# Patient Record
Sex: Female | Born: 1960 | Race: White | Hispanic: No | Marital: Married | Smoking: Never smoker
Health system: Southern US, Community
[De-identification: ages and names within clinical notes are randomized; demographics above are authoritative.]

## PROBLEM LIST (undated history)

## (undated) DIAGNOSIS — L509 Urticaria, unspecified: Secondary | ICD-10-CM

## (undated) DIAGNOSIS — I10 Essential (primary) hypertension: Secondary | ICD-10-CM

## (undated) DIAGNOSIS — A0472 Enterocolitis due to Clostridium difficile, not specified as recurrent: Secondary | ICD-10-CM

## (undated) DIAGNOSIS — K5792 Diverticulitis of intestine, part unspecified, without perforation or abscess without bleeding: Secondary | ICD-10-CM

## (undated) DIAGNOSIS — E785 Hyperlipidemia, unspecified: Secondary | ICD-10-CM

## (undated) DIAGNOSIS — T783XXA Angioneurotic edema, initial encounter: Secondary | ICD-10-CM

## (undated) DIAGNOSIS — K589 Irritable bowel syndrome without diarrhea: Secondary | ICD-10-CM

## (undated) HISTORY — DX: Essential (primary) hypertension: I10

## (undated) HISTORY — DX: Enterocolitis due to Clostridium difficile, not specified as recurrent: A04.72

## (undated) HISTORY — DX: Urticaria, unspecified: L50.9

## (undated) HISTORY — DX: Hyperlipidemia, unspecified: E78.5

## (undated) HISTORY — DX: Irritable bowel syndrome, unspecified: K58.9

## (undated) HISTORY — DX: Diverticulitis of intestine, part unspecified, without perforation or abscess without bleeding: K57.92

## (undated) HISTORY — DX: Angioneurotic edema, initial encounter: T78.3XXA

## (undated) HISTORY — PX: NASAL SEPTUM SURGERY: SHX37

---

## 2001-06-29 ENCOUNTER — Encounter: Admission: RE | Admit: 2001-06-29 | Discharge: 2001-06-29 | Payer: Self-pay | Admitting: Family Medicine

## 2001-06-29 ENCOUNTER — Encounter: Payer: Self-pay | Admitting: Family Medicine

## 2005-11-04 HISTORY — PX: COLONOSCOPY: SHX174

## 2008-03-09 ENCOUNTER — Encounter: Admission: RE | Admit: 2008-03-09 | Discharge: 2008-03-09 | Payer: Self-pay | Admitting: Family Medicine

## 2011-08-05 DIAGNOSIS — A0472 Enterocolitis due to Clostridium difficile, not specified as recurrent: Secondary | ICD-10-CM

## 2011-08-05 HISTORY — DX: Enterocolitis due to Clostridium difficile, not specified as recurrent: A04.72

## 2011-08-27 ENCOUNTER — Encounter: Payer: Self-pay | Admitting: Gastroenterology

## 2011-08-27 ENCOUNTER — Ambulatory Visit (INDEPENDENT_AMBULATORY_CARE_PROVIDER_SITE_OTHER): Payer: BC Managed Care – PPO | Admitting: Gastroenterology

## 2011-08-27 ENCOUNTER — Ambulatory Visit (HOSPITAL_COMMUNITY)
Admission: RE | Admit: 2011-08-27 | Discharge: 2011-08-27 | Disposition: A | Discharge status: Home / Self Care | Payer: BC Managed Care – PPO | Source: Ambulatory Visit | Attending: Gastroenterology | Admitting: Gastroenterology

## 2011-08-27 VITALS — BP 145/87 | HR 66 | Temp 97.8°F | Ht 59.0 in | Wt 137.4 lb

## 2011-08-27 DIAGNOSIS — R1030 Lower abdominal pain, unspecified: Secondary | ICD-10-CM

## 2011-08-27 DIAGNOSIS — R109 Unspecified abdominal pain: Secondary | ICD-10-CM | POA: Insufficient documentation

## 2011-08-27 DIAGNOSIS — K5732 Diverticulitis of large intestine without perforation or abscess without bleeding: Secondary | ICD-10-CM | POA: Insufficient documentation

## 2011-08-27 DIAGNOSIS — R197 Diarrhea, unspecified: Secondary | ICD-10-CM | POA: Insufficient documentation

## 2011-08-27 DIAGNOSIS — I10 Essential (primary) hypertension: Secondary | ICD-10-CM | POA: Insufficient documentation

## 2011-08-27 MED ORDER — IOHEXOL 300 MG/ML  SOLN
100.0000 mL | Freq: Once | INTRAMUSCULAR | Status: AC | PRN
  Administered 2011-08-27: 100 mL via INTRAVENOUS

## 2011-08-27 NOTE — Assessment & Plan Note (Signed)
Documented diverticulitis by CT scan on 07/29/2011. Followup CT on 07/31/2011 showed stable but active uncomplicated diverticulitis. The patient completed a course of clindamycin and Augmentin. Complains of worsening abdominal pain, nausea, unable to eat, diarrhea. At this point would be concerned about complicated diverticulitis versus C. difficile colitis. Discussed approach with patient. Given significant tenderness on exam, would recommend repeat CT scan. Will also check stool for C. difficile and culture. If these studies are unrevealing, next up would be colonoscopy.  For now she will consume a bland, soft diet. If she were to develop fever, worsening abdominal pain, bloody stools, refractory vomiting she should let us know or present to the nearest emergency department.

## 2011-08-27 NOTE — Progress Notes (Signed)
Cc to PCP 

## 2011-08-27 NOTE — Patient Instructions (Addendum)
Please collect your stool as soon as possible. We have scheduled you for a CT scan of your abdomen. We will call you when we receive results.  If you develop worsening abdominal pain, fever, vomiting, rectal bleeding he should let us know or go to the nearest emergency department. Please take a probiotic daily or consume Dannon yogurt once daily. Please stick to a bland, soft diet at this time. Once you are asymptomatic, you can resume a high fiber diet.

## 2011-08-27 NOTE — Progress Notes (Signed)
Primary Care Physician:  Criss Rosales, MD  Primary Gastroenterologist:  Roetta Sessions, MD   Chief Complaint  Patient presents with  . Abdominal Pain    with cramps  . Diarrhea  . Nausea  . Weight Loss    loss 4 lb in a week    HPI:  Jennifer Suarez is a 50 y.o. female here for further evaluation of nausea, abd pain, diarrhea, weight loss. Gives h/o recurrent diverticulitis and possible IBS. In 04/2005 she had CT which documented acute diverticulitis (first episode). She has had total of eight episodes of taking antibiotics for suspected diverticulitis since that time. At some point, she developed facial swelling while on cipro and flagyl so subsequent diverticulitis has been treated with clindamycin and augmentin. In 07/2011 she developed another attack and had two CTs both which showed uncomplicated diverticulitis. She feels like she has never gotton over it.   Had been doing good with consuming high fiber diet prior to 07/2011. She has been treated for diverticulitis twice since 03/2011, once in 2011, once in 2008.  Now increased caffeine, coffee, stress at work. End of September, LLQ pain, saw PCP (saw four different people as doctor on vacation). Clindamycin/augmentin. Never felt like fully recovered from that bout. Now more diffuse lower abd pain, Stools small, lost four pounds in one week, nausea. Have loose stool every 30 minutes over the past week. Scared to eat as it makes pain worse. Takes anaspaz and xanax only as needed for abd pain. Friday night pain severe and felt like needed to go to ED. Started clear liquid diet and now calm down a little. No fever. Feels poorly.     Current Outpatient Prescriptions  Medication Sig Dispense Refill  . ALPRAZolam (XANAX) 0.25 MG tablet Take 0.25 mg by mouth at bedtime as needed.       Marland Kitchen atenolol (TENORMIN) 50 MG tablet Take 50 mg by mouth daily.        . hydrochlorothiazide (HYDRODIURIL) 25 MG tablet Take 25 mg by mouth daily.       .  hyoscyamine (ANASPAZ) 0.125 MG TBDP Place 0.125 mg under the tongue every 4 (four) hours as needed.          Allergies as of 08/27/2011 - Review Complete 08/27/2011  Allergen Reaction Noted  . Ciprofloxacin Other (See Comments) 08/27/2011  . Flagyl (metronidazole hcl) Other (See Comments) 08/27/2011  . Macrobid Other (See Comments) 08/27/2011  . Sulfa antibiotics Other (See Comments) 08/27/2011    Past Medical History  Diagnosis Date  . Diverticulitis     recurrent, treated with antibiotics eight times since 2006. CT positive 04/2005, 07/2011  . Irritable bowel syndrome     Past Surgical History  Procedure Date  . Colonoscopy 2007    Dr. Bing Plume to moderate sigmioid diverticulosis with no evidence for diverticulitis  . Nasal septum surgery   . Exploratory laparoscopy     age 59    Family History  Problem Relation Age of Onset  . Lung cancer Father   . Lung cancer Sister     ovarian?  . Cancer Mother     metastatic primary unknown  . Colon cancer Neg Hx     History   Social History  . Marital Status: Married    Spouse Name: N/A    Number of Children: N/A  . Years of Education: N/A   Occupational History  . Not on file.   Social History Main Topics  . Smoking status:  Never Smoker   . Smokeless tobacco: Not on file  . Alcohol Use: No  . Drug Use: No  . Sexually Active: Not on file   Other Topics Concern  . Not on file   Social History Narrative  . No narrative on file      ROS:  General:See HPI. Eyes: Negative for vision changes.  ENT: Negative for hoarseness, difficulty swallowing , nasal congestion. CV: Negative for chest pain, angina, palpitations, dyspnea on exertion, peripheral edema.  Respiratory: Negative for dyspnea at rest, dyspnea on exertion, cough, sputum, wheezing.  GI: See history of present illness. GU:  Negative for dysuria, hematuria, urinary incontinence, urinary frequency, nocturnal urination.  MS: Negative for joint  pain, low back pain.  Derm: Negative for rash or itching.  Neuro: Negative for weakness, abnormal sensation, seizure, frequent headaches, memory loss, confusion.  Psych: Negative for anxiety, depression, suicidal ideation, hallucinations.  Endo: Negative for unusual weight change.  Heme: Negative for bruising or bleeding. Allergy: Negative for rash or hives.    Physical Examination:  BP 145/87  Pulse 66  Temp(Src) 97.8 F (36.6 C) (Temporal)  Ht 4\' 11"  (1.499 m)  Wt 137 lb 6.4 oz (62.324 kg)  BMI 27.75 kg/m2   General: Well-nourished, well-developed in no acute distress.  Head: Normocephalic, atraumatic.   Eyes: Conjunctiva pink, no icterus. Mouth: Oropharyngeal mucosa moist and pink , no lesions erythema or exudate. Neck: Supple without thyromegaly, masses, or lymphadenopathy.  Lungs: Clear to auscultation bilaterally.  Heart: Regular rate and rhythm, no murmurs rubs or gallops.  Abdomen: Bowel sounds are normal, diffuse lower abd tenderness, moderate, nondistended, no hepatosplenomegaly or masses, no abdominal bruits or    hernia , no rebound or guarding.   Rectal: Not performed. Extremities: No lower extremity edema. No clubbing or deformities.  Neuro: Alert and oriented x 4 , grossly normal neurologically.  Skin: Warm and dry, no rash or jaundice.   Psych: Alert and cooperative, normal mood and affect.  Labs: 07/31/2011 Glucose 80, BUN 9, creatinine 0.87, calcium 9.1, albumin 4.1, alkaline phosphatase 63, AST 37, ALT 31, white blood cell count 13, hemoglobin 13.7, platelets 218 total bilirubin 0.6  Imaging Studies: CT of the abdomen and pelvis with contrast from 07/31/2011, compared to 07/29/2011. Physical inflammatory process involving the proximal sigmoid colon compatible with diverticulitis, mild paralytic ileus involving the left lower quadrant small bowel loops.

## 2011-08-28 ENCOUNTER — Other Ambulatory Visit: Payer: Self-pay | Admitting: Gastroenterology

## 2011-08-28 ENCOUNTER — Telehealth: Payer: Self-pay

## 2011-08-28 DIAGNOSIS — K5732 Diverticulitis of large intestine without perforation or abscess without bleeding: Secondary | ICD-10-CM

## 2011-08-28 LAB — CLOSTRIDIUM DIFFICILE BY PCR: Toxigenic C. Difficile by PCR: DETECTED — CR

## 2011-08-28 MED ORDER — AMOXICILLIN-POT CLAVULANATE 875-125 MG PO TABS: 1.0000 | ORAL_TABLET | Freq: Two times a day (BID) | ORAL | Status: AC

## 2011-08-28 NOTE — Progress Notes (Signed)
Quick Note:  Patient also with CDiff. Allergic to flagyl. Started augmentin today for persistent diverticulitis.  Need to start vancomycin 125mg  po QID for three weeks. Quantity sufficient.  Very important she take probiotic as previously noted. OV as planned. She should call if worsening diarrhea, abd pain, fever, vomiting. ______

## 2011-08-28 NOTE — Progress Notes (Signed)
Quick Note:  Tried to call pt- LMOM, spoke with pharmacist at [facility], they got augmentin rx but pt has not picked it up yet. Tried to give her rx for vancomycin but she said she didn't have it and we would have to call it in somewhere else. Waiting on return call from pt to get another pharmacy. ______

## 2011-08-28 NOTE — Progress Notes (Signed)
Quick Note:  Please let patient noted that her CT scan shows smoldering diverticulitis but no complications at this point. I have discussed findings with Dr. Jena Gauss. She needs to take any other course of Augmentin but this time for 3 weeks. For the next 3 days she should consume clear liquid diet only and then slowly graduate to a soft low fiber diet until resolution of her symptoms. Stool test are still pending.  Augmentin 875/125 mg by mouth twice a day for 21 days, quantity sufficient. SENT TO [facility] She needs to take a probiotic daily, ie Align or [employer]. She may consume Danon yogurt once daily instead if she prefers.  Needs OV in 3 weeks. If clinically doing better, we will plan on scheduling colonoscopy at that time. ______

## 2011-08-28 NOTE — Telephone Encounter (Signed)
T/C from [facility], C-Diff is positive.

## 2011-08-29 NOTE — Progress Notes (Signed)
Quick Note:  Please have the patient call us on Monday with a PR. ______

## 2011-08-29 NOTE — Telephone Encounter (Signed)
Addressed.

## 2011-08-31 LAB — STOOL CULTURE

## 2011-09-19 ENCOUNTER — Encounter: Payer: Self-pay | Admitting: General Practice

## 2011-09-19 ENCOUNTER — Encounter: Payer: Self-pay | Admitting: Gastroenterology

## 2011-09-19 ENCOUNTER — Ambulatory Visit (INDEPENDENT_AMBULATORY_CARE_PROVIDER_SITE_OTHER): Payer: BC Managed Care – PPO | Admitting: Gastroenterology

## 2011-09-19 VITALS — BP 130/70 | HR 70 | Temp 97.6°F | Ht 59.0 in | Wt 143.8 lb

## 2011-09-19 DIAGNOSIS — R197 Diarrhea, unspecified: Secondary | ICD-10-CM

## 2011-09-19 DIAGNOSIS — K5792 Diverticulitis of intestine, part unspecified, without perforation or abscess without bleeding: Secondary | ICD-10-CM

## 2011-09-19 DIAGNOSIS — K5732 Diverticulitis of large intestine without perforation or abscess without bleeding: Secondary | ICD-10-CM

## 2011-09-19 MED ORDER — ALIGN 4 MG PO CAPS: 4.0000 mg | ORAL_CAPSULE | Freq: Every day | ORAL | Status: DC

## 2011-09-19 NOTE — Assessment & Plan Note (Signed)
Recent acute diarrhea due to C Diff, treated with vancomycin given possible allergy to flagyl. Doing better but not quite back at baseline. H/O IBS. Recommend probiotics. Levsin as needed. Slowly add back fiber to diet.

## 2011-09-19 NOTE — Assessment & Plan Note (Addendum)
Recurrent diverticulitis, clinically resolved. Needs updated colonoscopy given abnormal CT findings and prior to possible surgical intervention. Plan for 3 weeks from now, she will be off antibiotics for 3-4 weeks.  I have discussed the risks, alternatives, benefits with regards to but not limited to the risk of reaction to medication, bleeding, infection, perforation and the patient is agreeable to proceed. Written consent to be obtained.  Arrange for referral to [facility].

## 2011-09-19 NOTE — Patient Instructions (Signed)
Please see separate instructions for colonoscopy. We will refer you to [facility] for consideration of elective surgery for recurrent diverticulitis. We have provided you with samples of Align. Please take one daily. When you run out of Align, he may buy this over-the-counter or other good options include [employer], Susten[employer].

## 2011-09-19 NOTE — Progress Notes (Signed)
Primary Care Physician:  Criss Rosales, MD  Primary Gastroenterologist:  Roetta Sessions, MD   Chief Complaint  Patient presents with  . Follow-up    HPI:  Jennifer Suarez is a 50 y.o. female here for f/u diverticulitis and C.Diff. She feels better. Stools are more formed but still multiple daily, at least five. C/O lot of gas. Stools are pencil thin. No n/v. Appetite improved. No heartburn, dysphagia. No fever. Completed antibiotics couple of days ago. Ready to see surgeon for elective partial colectomy. Needs colonoscopy prior to surgery to evaluate abnormal CT.  Current Outpatient Prescriptions  Medication Sig Dispense Refill  . ALPRAZolam (XANAX) 0.25 MG tablet Take 0.25 mg by mouth at bedtime as needed.       Marland Kitchen atenolol (TENORMIN) 50 MG tablet Take 50 mg by mouth daily.        . hydrochlorothiazide (HYDRODIURIL) 25 MG tablet Take 25 mg by mouth daily.       . hyoscyamine (ANASPAZ) 0.125 MG TBDP Place 0.125 mg under the tongue every 4 (four) hours as needed.        . Probiotic Product (ALIGN) 4 MG CAPS Take 4 mg by mouth daily.  30 capsule  0    Allergies as of 09/19/2011 - Review Complete 09/19/2011  Allergen Reaction Noted  . Ciprofloxacin Other (See Comments) 08/27/2011  . Flagyl (metronidazole hcl) Other (See Comments) 08/27/2011  . Macrobid Other (See Comments) 08/27/2011  . Sulfa antibiotics Other (See Comments) 08/27/2011    Past Medical History  Diagnosis Date  . Diverticulitis     recurrent, treated with antibiotics 9 times since 2006. CT positive 04/2005, 07/2011, 08/2011  . Irritable bowel syndrome   . Clostridium difficile colitis 08/2011    Past Surgical History  Procedure Date  . Colonoscopy 2007    Dr. Bing Plume to moderate sigmioid diverticulosis with no evidence for diverticulitis  . Nasal septum surgery   . Exploratory laparoscopy     age 72    Family History  Problem Relation Age of Onset  . Lung cancer Father   . Lung cancer Sister       ovarian?  . Cancer Mother     metastatic primary unknown  . Colon cancer Neg Hx     History   Social History  . Marital Status: Married    Spouse Name: N/A    Number of Children: N/A  . Years of Education: N/A   Occupational History  . manager of legal office    Social History Main Topics  . Smoking status: Never Smoker   . Smokeless tobacco: Not on file  . Alcohol Use: No  . Drug Use: No  . Sexually Active: Not on file   Other Topics Concern  . Not on file   Social History Narrative  . No narrative on file      ROS:  General: Negative for anorexia, weight loss, fever, chills, fatigue, weakness. Eyes: Negative for vision changes.  ENT: Negative for hoarseness, difficulty swallowing , nasal congestion. CV: Negative for chest pain, angina, palpitations, dyspnea on exertion, peripheral edema.  Respiratory: Negative for dyspnea at rest, dyspnea on exertion, cough, sputum, wheezing.  GI: See history of present illness. GU:  Negative for dysuria, hematuria, urinary incontinence, urinary frequency, nocturnal urination.  MS: Negative for joint pain, low back pain.  Derm: Negative for rash or itching.  Neuro: Negative for weakness, abnormal sensation, seizure, frequent headaches, memory loss, confusion.  Psych: Negative for anxiety, depression, suicidal  ideation, hallucinations. Worried about husband's illnesses. Endo: Negative for unusual weight change.  Heme: Negative for bruising or bleeding. Allergy: Negative for rash or hives.    Physical Examination:  BP 130/70  Pulse 70  Temp(Src) 97.6 F (36.4 C) (Temporal)  Ht 4\' 11"  (1.499 m)  Wt 143 lb 12.8 oz (65.227 kg)  BMI 29.04 kg/m2   General: Well-nourished, well-developed in no acute distress.  Head: Normocephalic, atraumatic.   Eyes: Conjunctiva pink, no icterus. Mouth: Oropharyngeal mucosa moist and pink , no lesions erythema or exudate. Neck: Supple without thyromegaly, masses, or lymphadenopathy.   Lungs: Clear to auscultation bilaterally.  Heart: Regular rate and rhythm, no murmurs rubs or gallops.  Abdomen: Bowel sounds are normal, minimal lower abd tenderness, nondistended, no hepatosplenomegaly or masses, no abdominal bruits or    hernia , no rebound or guarding.   Rectal: Not performed. Extremities: No lower extremity edema. No clubbing or deformities.  Neuro: Alert and oriented x 4 , grossly normal neurologically.  Skin: Warm and dry, no rash or jaundice.   Psych: Alert and cooperative, normal mood and affect.

## 2011-09-20 ENCOUNTER — Telehealth: Payer: Self-pay | Admitting: Gastroenterology

## 2011-09-20 NOTE — Telephone Encounter (Signed)
Referral and notes faxed to [facility]

## 2011-09-23 ENCOUNTER — Telehealth: Payer: Self-pay | Admitting: Gastroenterology

## 2011-09-23 NOTE — Telephone Encounter (Signed)
Received fax from  [facility]- Pt is scheduled to see Dr Carolynne Edouard on 10/01/11 @ 9:10- she is aware of appt

## 2011-10-01 ENCOUNTER — Ambulatory Visit (INDEPENDENT_AMBULATORY_CARE_PROVIDER_SITE_OTHER): Payer: BC Managed Care – PPO | Admitting: General Surgery

## 2011-10-01 ENCOUNTER — Encounter (INDEPENDENT_AMBULATORY_CARE_PROVIDER_SITE_OTHER): Payer: Self-pay | Admitting: General Surgery

## 2011-10-01 DIAGNOSIS — K5732 Diverticulitis of large intestine without perforation or abscess without bleeding: Secondary | ICD-10-CM

## 2011-10-01 DIAGNOSIS — A0472 Enterocolitis due to Clostridium difficile, not specified as recurrent: Secondary | ICD-10-CM | POA: Insufficient documentation

## 2011-10-01 NOTE — Patient Instructions (Signed)
Continue diet for diverticulitis

## 2011-10-04 ENCOUNTER — Telehealth: Payer: Self-pay | Admitting: Gastroenterology

## 2011-10-04 NOTE — Telephone Encounter (Signed)
Thanks. Let's refer to [facility] if patient interested.

## 2011-10-04 NOTE — Telephone Encounter (Signed)
Called [facility], they do not test for medication allergies - they refer to either [facility] or [facility]

## 2011-10-04 NOTE — Telephone Encounter (Signed)
Patient inquired about allergy testing to determine which antibiotic she is allergic to. Had facial swelling while taking combination of cipro and flagy.   Please call [facility]. Ask if they are capable of doing testing to determine if a patient is allergic to flagy, cipro, sulfa. If so, please make referral.

## 2011-10-07 ENCOUNTER — Encounter (HOSPITAL_COMMUNITY): Payer: Self-pay | Admitting: Pharmacy Technician

## 2011-10-07 MED ORDER — SODIUM CHLORIDE 0.45 % IV SOLN
Freq: Once | INTRAVENOUS | Status: AC
  Administered 2011-10-08: 14:00:00 via INTRAVENOUS

## 2011-10-07 NOTE — Telephone Encounter (Signed)
Called pt LM w/ husband

## 2011-10-07 NOTE — Telephone Encounter (Signed)
Noted. She can just let us know if/when she wants to pursue it.

## 2011-10-07 NOTE — Telephone Encounter (Signed)
Pt called back- she would like to wait on a referral to either [facility] or [facility] until after her upcoming TCS-

## 2011-10-08 ENCOUNTER — Encounter (HOSPITAL_COMMUNITY): Payer: Self-pay | Admitting: *Deleted

## 2011-10-08 ENCOUNTER — Encounter (HOSPITAL_COMMUNITY)
Admission: RE | Disposition: A | Discharge status: Home / Self Care | Payer: Self-pay | Source: Ambulatory Visit | Attending: Internal Medicine

## 2011-10-08 ENCOUNTER — Ambulatory Visit (HOSPITAL_COMMUNITY)
Admission: RE | Admit: 2011-10-08 | Discharge: 2011-10-08 | Disposition: A | Discharge status: Home / Self Care | Payer: BC Managed Care – PPO | Source: Ambulatory Visit | Attending: Internal Medicine | Admitting: Internal Medicine

## 2011-10-08 DIAGNOSIS — K5792 Diverticulitis of intestine, part unspecified, without perforation or abscess without bleeding: Secondary | ICD-10-CM

## 2011-10-08 DIAGNOSIS — K5730 Diverticulosis of large intestine without perforation or abscess without bleeding: Secondary | ICD-10-CM

## 2011-10-08 DIAGNOSIS — R933 Abnormal findings on diagnostic imaging of other parts of digestive tract: Secondary | ICD-10-CM | POA: Insufficient documentation

## 2011-10-08 HISTORY — PX: COLONOSCOPY: SHX5424

## 2011-10-08 SURGERY — COLONOSCOPY
Anesthesia: Moderate Sedation

## 2011-10-08 MED ORDER — MEPERIDINE HCL 100 MG/ML IJ SOLN
INTRAMUSCULAR | Status: AC
  Filled 2011-10-08: qty 2

## 2011-10-08 MED ORDER — MEPERIDINE HCL 100 MG/ML IJ SOLN
INTRAMUSCULAR | Status: DC | PRN
  Administered 2011-10-08: 25 mg via INTRAVENOUS
  Administered 2011-10-08: 50 mg via INTRAVENOUS
  Administered 2011-10-08: 25 mg via INTRAVENOUS

## 2011-10-08 MED ORDER — STERILE WATER FOR IRRIGATION IR SOLN
Status: DC | PRN
  Administered 2011-10-08: 15:00:00

## 2011-10-08 MED ORDER — MIDAZOLAM HCL 5 MG/5ML IJ SOLN
INTRAMUSCULAR | Status: AC
  Filled 2011-10-08: qty 10

## 2011-10-08 MED ORDER — MIDAZOLAM HCL 5 MG/5ML IJ SOLN
INTRAMUSCULAR | Status: DC | PRN
  Administered 2011-10-08 (×2): 2 mg via INTRAVENOUS
  Administered 2011-10-08: 1 mg via INTRAVENOUS

## 2011-10-08 NOTE — H&P (Signed)
  I have seen & examined the patient prior to the procedure(s) today and reviewed the history and physical/consultation.  There have been no changes.  After consideration of the risks, benefits, alternatives and imponderables, the patient has consented to the procedure(s).   

## 2011-10-11 ENCOUNTER — Encounter (INDEPENDENT_AMBULATORY_CARE_PROVIDER_SITE_OTHER): Payer: Self-pay | Admitting: General Surgery

## 2011-10-11 NOTE — Progress Notes (Signed)
Subjective:     Patient ID: Jennifer Suarez, female   DOB: [birth year], 50 y.o.   MRN: [id number]  HPI We are asked to see the patient in consultation by Dr. Selinda Flavin to evaluate her for diverticulitis. The patient is a 50 year old white female who has apparently had several episodes of diverticulitis dating back to about 2006. She had a CT scan in 2006 that documented the diverticulitis. She also had a CT scan recently that also documented uncomplicated diverticulitis of the sigmoid colon. Since 2006 anytime she had some lower abdominal pain she went to her medical doctor and was placed on antibiotics. There is not good documentation as to which of these episodes was due to diverticulitis. With her most recent episode of diverticulitis she was started on antibiotics but developed C. difficile colitis and got worse. She was then treated with p.o. vancomycin for 3 weeks and she is better now. She currently denies any fevers or chills and her abdominal pain is almost completely resolved. She is scheduled for colonoscopy in the next couple weeks.  Review of Systems  Constitutional: Negative.   HENT: Negative.   Eyes: Negative.   Respiratory: Negative.   Cardiovascular: Negative.   Gastrointestinal: Positive for abdominal pain.  Genitourinary: Negative.   Musculoskeletal: Negative.   Skin: Negative.   Neurological: Negative.   Hematological: Negative.   Psychiatric/Behavioral: Negative.        Objective:   Physical Exam  Constitutional: She is oriented to person, place, and time. She appears well-developed and well-nourished.  HENT:  Head: Normocephalic and atraumatic.  Eyes: Conjunctivae and EOM are normal. Pupils are equal, round, and reactive to light.  Neck: Normal range of motion. Neck supple.  Cardiovascular: Normal rate, regular rhythm and normal heart sounds.   Pulmonary/Chest: Effort normal and breath sounds normal.  Abdominal: Soft. Bowel sounds are normal.       Mild left  lower quadrant pain. no palpable mass. No palpable guarding.  Musculoskeletal: Normal range of motion.  Neurological: She is alert and oriented to person, place, and time.  Skin: Skin is warm and dry.  Psychiatric: She has a normal mood and affect. Her behavior is normal.       Assessment:     Simple sigmoid diverticulitis complicated by C. difficile colitis    Plan:     At this point I have no way of documenting that she's had more than 2 episodes of simple diverticulitis in the last 6 years or so. Her most recent scan showed no evidence of complicating features of diverticulitis. It may have been that her more recent episode was more severe because of the C. difficile colitis. I recommended some time off medications. I would also like to see what her colonoscopy shows in the next couple weeks. At this point I would be inclined to follow her over time and see if she develops more episodes of diverticulitis. If she does then she might benefit from an elective sigmoid colectomy. I would also like to give her more time to get over the C. difficile colitis before subjecting her to more antibiotics and stress.

## 2011-10-14 ENCOUNTER — Ambulatory Visit (INDEPENDENT_AMBULATORY_CARE_PROVIDER_SITE_OTHER): Payer: BC Managed Care – PPO | Admitting: General Surgery

## 2011-10-14 ENCOUNTER — Encounter (INDEPENDENT_AMBULATORY_CARE_PROVIDER_SITE_OTHER): Payer: Self-pay | Admitting: General Surgery

## 2011-10-14 VITALS — BP 146/90 | HR 64 | Temp 97.2°F | Resp 16 | Ht 59.0 in | Wt 137.4 lb

## 2011-10-14 DIAGNOSIS — K5732 Diverticulitis of large intestine without perforation or abscess without bleeding: Secondary | ICD-10-CM

## 2011-10-14 NOTE — Patient Instructions (Signed)
Call if you have any flare up of symtoms

## 2011-10-14 NOTE — Progress Notes (Signed)
Subjective:     Patient ID: Jennifer Suarez, female   DOB: 1961/03/12, 50 y.o.   MRN: [id number]  HPI The patient is a 50 year old white female who we saw her recently with sigmoid diverticulitis complicated by C. difficile colitis. She is now recovered and is pain-free. She had a recent colonoscopy that showed only some diverticuli in the sigmoid colon but no inflammatory change. She feels good today.  Review of Systems     Objective:   Physical Exam On exam her abdomen is soft and nontender. No palpable mass or hepatosplenomegaly.    Assessment:     Possible recurring simple diverticulitis of the sigmoid colon    Plan:     At this point she will try a diet modifications. If she has any recurrence of her left lower quadrant pain or fever she agrees to call us right away.

## 2011-10-17 ENCOUNTER — Encounter (HOSPITAL_COMMUNITY): Payer: Self-pay | Admitting: Internal Medicine

## 2012-04-03 ENCOUNTER — Encounter: Payer: Self-pay | Admitting: Internal Medicine

## 2013-11-04 DIAGNOSIS — E119 Type 2 diabetes mellitus without complications: Secondary | ICD-10-CM

## 2013-11-04 HISTORY — DX: Type 2 diabetes mellitus without complications: E11.9

## 2015-02-09 ENCOUNTER — Telehealth: Payer: Self-pay | Admitting: Nurse Practitioner

## 2015-02-09 ENCOUNTER — Other Ambulatory Visit: Payer: Self-pay

## 2015-02-09 ENCOUNTER — Encounter: Payer: Self-pay | Admitting: Nurse Practitioner

## 2015-02-09 ENCOUNTER — Ambulatory Visit (INDEPENDENT_AMBULATORY_CARE_PROVIDER_SITE_OTHER): Payer: 59 | Admitting: Nurse Practitioner

## 2015-02-09 ENCOUNTER — Encounter (INDEPENDENT_AMBULATORY_CARE_PROVIDER_SITE_OTHER): Payer: Self-pay

## 2015-02-09 VITALS — BP 117/75 | HR 58 | Temp 97.6°F | Ht 59.0 in | Wt 138.4 lb

## 2015-02-09 DIAGNOSIS — R1030 Lower abdominal pain, unspecified: Secondary | ICD-10-CM

## 2015-02-09 DIAGNOSIS — K5732 Diverticulitis of large intestine without perforation or abscess without bleeding: Secondary | ICD-10-CM

## 2015-02-09 LAB — COMPREHENSIVE METABOLIC PANEL
ALK PHOS: 56 U/L (ref 39–117)
ALT: 18 U/L (ref 0–35)
AST: 21 U/L (ref 0–37)
Albumin: 4.1 g/dL (ref 3.5–5.2)
BILIRUBIN TOTAL: 0.6 mg/dL (ref 0.2–1.2)
BUN: 11 mg/dL (ref 6–23)
CALCIUM: 8.7 mg/dL (ref 8.4–10.5)
CHLORIDE: 96 meq/L (ref 96–112)
CO2: 26 mEq/L (ref 19–32)
CREATININE: 0.78 mg/dL (ref 0.50–1.10)
Glucose, Bld: 123 mg/dL — ABNORMAL HIGH (ref 70–99)
Potassium: 3.4 mEq/L — ABNORMAL LOW (ref 3.5–5.3)
Sodium: 138 mEq/L (ref 135–145)
Total Protein: 6.3 g/dL (ref 6.0–8.3)

## 2015-02-09 LAB — LIPASE: LIPASE: 13 U/L (ref 0–75)

## 2015-02-09 NOTE — Telephone Encounter (Signed)
Tried to call pt- LMOM 

## 2015-02-09 NOTE — Patient Instructions (Signed)
1. Have your labs and CT done ASAP. 2. We will call you when we get the results 3. We will work on arranging the CT at the [place] 4. Follow-up in 6-8 weeks for routine check or sooner if there's any changes 5. If your abdominal pain becomes severe, you have uncontrollable nausea and vomiting, inability to tolerate drinking fluids, or a sudden high fever proceed to the ER.

## 2015-02-09 NOTE — Progress Notes (Signed)
Referring Provider: Selinda Flavin, MD Primary Care Physician:  Selinda Flavin, MD Primary GI: Dr. Jena Gauss  Chief Complaint  Patient presents with  . Diverticulosis    HPI:   54 year old female presents for evaluation of diverticulosis. Has a history of sigmoid diverticulosis with repeated episodes of diverticulitis treated with antibiotics. Typically her PCP we'll treat her with antibiotics without CT scan evaluation. She was seen by surgery for evaluation of elective sigmoid colectomy, however because no CT has been done for most of her episodes recently to document greater than 3 episodes and timeframe. At that point decide to electively follow her over time and further evaluate if repeat issues. Last colonoscopy done 10/08/2011 showed left sided colonic diverticulosis and recommended substantial daily fiber supplementation, and evaluation for elective segmental resection of the affected colon to minimize chances of recurrent and/or complicated diverticulitis in the future.  Today she states she's been following a relatively strict diverticulosis diet. She called her surgeon who said it had been too long since last seeing her. She has had worsening abdominal tenderness over the past couple/few weeks. In the past couple days her abdominal pain is getting worse. Yesterday she was huring a lot and finally tried taking her Apaspaz and Motrin which helped some. She has put herself on a BRAT diet as of yesterday. Still having some pain today which is in her LLQ with some sharp shooting characteristics. Admits some nausea, denies vomiting. Has had a lot of stress at work as well. Denies fever, chills, Last bowel movement was yesterday and formed and soft.   Past Medical History  Diagnosis Date  . Diverticulitis     recurrent, treated with antibiotics 9 times since 2006. CT positive 04/2005, 07/2011, 08/2011  . Irritable bowel syndrome   . Clostridium difficile colitis 08/2011  . Hypertension   .  Hyperlipidemia     Past Surgical History  Procedure Laterality Date  . Colonoscopy  2007    Dr. Bing Plume to moderate sigmioid diverticulosis with no evidence for diverticulitis  . Nasal septum surgery    . Exploratory laparoscopy      age 53  . Colonoscopy  10/08/2011    ZOX:[id number] sided colonic diverticulosis    Current Outpatient Prescriptions  Medication Sig Dispense Refill  . ALPRAZolam (XANAX) 0.25 MG tablet Take 0.25 mg by mouth daily as needed. Anxiety    . atenolol (TENORMIN) 50 MG tablet Take 50 mg by mouth daily.      Marland Kitchen atorvastatin (LIPITOR) 20 MG tablet     . hydrochlorothiazide (MICROZIDE) 12.5 MG capsule Take 12.5 mg by mouth daily.      . hyoscyamine (ANASPAZ) 0.125 MG TBDP Place 0.125 mg under the tongue every 4 (four) hours as needed. Relax Intestine    . metFORMIN (GLUCOPHAGE-XR) 500 MG 24 hr tablet Take 1,000 mg by mouth daily with breakfast.     . Probiotic Product (ALIGN) 4 MG CAPS Take 4 mg by mouth daily. 30 capsule 0  . PROAIR HFA 108 (90 BASE) MCG/ACT inhaler      No current facility-administered medications for this visit.    Allergies as of 02/09/2015 - Review Complete 02/09/2015  Allergen Reaction Noted  . Flagyl [metronidazole hcl] Other (See Comments) 08/27/2011  . Nitrofurantoin monohyd macro Other (See Comments) 08/27/2011  . Sulfa antibiotics Other (See Comments) 08/27/2011  . Ciprofloxacin Swelling 08/27/2011    Family History  Problem Relation Age of Onset  . Lung cancer Father   .  Cancer Father   . Lung cancer Sister     ovarian?  . Cancer Sister     lung and ovarian  . Cancer Mother     metastatic primary unknown  . Colon cancer Neg Hx     History   Social History  . Marital Status: Married    Spouse Name: N/A  . Number of Children: N/A  . Years of Education: N/A   Occupational History  . manager of legal office    Social History Main Topics  . Smoking status: Never Smoker   . Smokeless tobacco: Never Used  .  Alcohol Use: No  . Drug Use: No  . Sexual Activity: Not on file   Other Topics Concern  . None   Social History Narrative    Review of Systems: General: Negative for anorexia, weight loss, fever, chills, fatigue, weakness. Eyes: Negative for vision changes.  ENT: Negative for difficulty swallowing. CV: Negative for chest pain, angina, palpitations, peripheral edema.  Respiratory: Negative for dyspnea at rest, cough, wheezing.  GI: See history of present illness. MS: Negative for joint pain, low back pain.  Derm: Negative for rash or itching.  Neuro: Negative for weakness, abnormal sensation, seizure, frequent headaches, memory loss, confusion.  Psych: Negative for anxiety, depression, suicidal ideation, hallucinations.  Endo: Negative for unusual weight change.  Heme: Negative for bruising or bleeding. Allergy: Negative for rash or hives.   Physical Exam: BP 117/75 mmHg  Pulse 58  Temp(Src) 97.6 F (36.4 C) (Oral)  Ht 4\' 11"  (1.499 m)  Wt 138 lb 6.4 oz (62.778 kg)  BMI 27.94 kg/m2 General:   Alert and oriented. No distress noted. Pleasant and cooperative. Appears in pain but non-toxic. Head:  Normocephalic and atraumatic. Eyes:  Conjuctiva clear without scleral icterus. Neck:  Supple, without mass or thyromegaly. Lungs:  Clear to auscultation bilaterally. No wheezes, rales, or rhonchi. No distress.  Heart:  S1, S2 present without murmurs, rubs, or gallops. Regular rate and rhythm. Abdomen:  +BS, soft, and non-distended. Moderate to severe TTP LLQ. No rebound or guarding. No HSM or masses noted. Msk:  Symmetrical without gross deformities. Normal posture. Pulses:  2+ DP noted bilaterally Extremities:  Without edema. Neurologic:  Alert and  oriented x4;  grossly normal neurologically. Skin:  Intact without significant lesions or rashes. Psych:  Alert and cooperative. Normal mood and affect.    02/09/2015 10:23 AM

## 2015-02-09 NOTE — Telephone Encounter (Signed)
Receive the completed CT of abdomen and [facility]. Impression includes no acute findings involving the abdomen or pelvis, fatty infiltration of the liver, diverticular disease without associated inflammation, likely tiny bilateral renal cysts. Labs still pending. Please call the patient notify her that her CT does not indicate she has acute diverticulitis. Continue taking the hyoscyamine (Anaspaz) that has been helping her abdominal pain. We will call her when we get the results of her labs.

## 2015-02-10 ENCOUNTER — Telehealth: Payer: Self-pay | Admitting: Internal Medicine

## 2015-02-10 ENCOUNTER — Telehealth: Payer: Self-pay | Admitting: Nurse Practitioner

## 2015-02-10 LAB — CBC WITH DIFFERENTIAL/PLATELET
BASOS ABS: 0 10*3/uL (ref 0.0–0.1)
Basophils Relative: 0 % (ref 0–1)
Eosinophils Absolute: 0.3 10*3/uL (ref 0.0–0.7)
Eosinophils Relative: 3 % (ref 0–5)
HEMATOCRIT: 40.7 % (ref 36.0–46.0)
Hemoglobin: 13.5 g/dL (ref 12.0–15.0)
LYMPHS ABS: 3.1 10*3/uL (ref 0.7–4.0)
LYMPHS PCT: 34 % (ref 12–46)
MCH: 29.4 pg (ref 26.0–34.0)
MCHC: 33.2 g/dL (ref 30.0–36.0)
MCV: 88.7 fL (ref 78.0–100.0)
MONO ABS: 0.4 10*3/uL (ref 0.1–1.0)
Monocytes Relative: 4 % (ref 3–12)
NEUTROS ABS: 5.4 10*3/uL (ref 1.7–7.7)
Neutrophils Relative %: 59 % (ref 43–77)
PLATELETS: 225 10*3/uL (ref 150–400)
RBC: 4.59 MIL/uL (ref 3.87–5.11)
RDW: 12.9 % (ref 11.5–15.5)
WBC: 9.1 10*3/uL (ref 4.0–10.5)

## 2015-02-10 NOTE — Telephone Encounter (Signed)
Received message to please call patient. Called patient and reexplained her CT and lab results. She states she started having diarrhea today. Offered to do stool studies to check for intestinal infection which she declined. She states the medicine she has been taking has been helping. Advised to continue this. If she has severe symptoms (severe abdominal pain, high fever, intractable N/V, etc) to proceed to the ER. Advised to call us on Monday to let us know how she's doing.

## 2015-02-10 NOTE — Telephone Encounter (Signed)
Pt would like a copy of the CT scan and labs faxed to her at [phone].

## 2015-02-10 NOTE — Telephone Encounter (Signed)
I spoke with the pt.  

## 2015-02-10 NOTE — Telephone Encounter (Signed)
Patient left message returning call, please call back at [phone]

## 2015-02-10 NOTE — Telephone Encounter (Signed)
Pt is aware, she is still having cramps and diarrhea today. Her lab results are in epic.

## 2015-02-10 NOTE — Telephone Encounter (Signed)
Called and spoke with patient. See new telephone note for details.

## 2015-02-13 ENCOUNTER — Other Ambulatory Visit: Payer: Self-pay

## 2015-02-13 ENCOUNTER — Other Ambulatory Visit: Payer: Self-pay | Admitting: Gastroenterology

## 2015-02-13 DIAGNOSIS — R197 Diarrhea, unspecified: Secondary | ICD-10-CM

## 2015-02-13 NOTE — Telephone Encounter (Signed)
If she is having diarrhea persistent for more than 3-4 days, I would recommend checking stool for culture, C.diff PCR. If GI bug, then she would be contagious if ongoing diarrhea. But hard to say if that's what it is based on limited information.   If her heartburn is only occasional or just started, she can use Zantac 150mg  BID prn or TUMS as per label.   Please save for Eric's review.

## 2015-02-13 NOTE — Telephone Encounter (Signed)
Pt called office again and was wanting to know if she does have a GI Bug is she contagious .  States she is working today.

## 2015-02-13 NOTE — Telephone Encounter (Addendum)
Pt called and she still diarrhea and cramping is better. States she feels like she is having heartburn. States she still is not back to normal but she is better than she was when she came to us last week. Please advise

## 2015-02-13 NOTE — Telephone Encounter (Signed)
Noted. Called pt and she is aware of recommendations. Wants to wait and think about doing stool studies.

## 2015-02-14 LAB — C. DIFFICILE GDH AND TOXIN A/B
C. difficile GDH: NOT DETECTED
C. difficile Toxin A/B: NOT DETECTED

## 2015-02-14 NOTE — Progress Notes (Signed)
Quick Note:  I have no idea why this was still done. I cancelled this one that Candy put in wrong and ordered the PCR (which is in process). ______

## 2015-02-16 ENCOUNTER — Other Ambulatory Visit: Payer: Self-pay

## 2015-02-16 ENCOUNTER — Telehealth: Payer: Self-pay | Admitting: Internal Medicine

## 2015-02-16 DIAGNOSIS — R197 Diarrhea, unspecified: Secondary | ICD-10-CM

## 2015-02-16 LAB — STOOL CULTURE

## 2015-02-16 LAB — CLOSTRIDIUM DIFFICILE BY PCR: CDIFFPCR: NOT DETECTED

## 2015-02-16 NOTE — Telephone Encounter (Signed)
Culture is still pending. I called [facility] to make sure they were doing cdiff by pcr. They said they did not have any orders for that. I had them add it on to the stool they still had. cdiff by pcr orders were never released and that is why they couldn't see those orders. They were able to add it on and it is pending now.

## 2015-02-16 NOTE — Progress Notes (Signed)
Quick Note:  Please let patient know her stool culture and Cdiff were negative.  Defer to Minerva Areolaric who recently saw her. Looks like there is a pending CT [place]? ______

## 2015-02-16 NOTE — Telephone Encounter (Signed)
PATIENT CALLED INQUIRING ABOUT STOOL SAMPLE RESULTS,  EXPLAINED TO THE PATIENT IT CAN TAKE 7-10 DAYS AND THE NURSE WOULD BE CALLING HER AS SOON AS RESULTS WERE IN

## 2015-02-16 NOTE — Telephone Encounter (Signed)
Thanks

## 2015-02-17 ENCOUNTER — Telehealth: Payer: Self-pay | Admitting: Internal Medicine

## 2015-02-17 ENCOUNTER — Telehealth: Payer: Self-pay | Admitting: Nurse Practitioner

## 2015-02-17 NOTE — Telephone Encounter (Signed)
Patient is also burping a lot.  No nausea or vomiting   Routing to Doris to follow-up with Verlon AuLeslie  Please call her at [phone]

## 2015-02-17 NOTE — Telephone Encounter (Signed)
There are lab results in EG box for review.

## 2015-02-17 NOTE — Telephone Encounter (Signed)
Routing to Leslie Lewis, PA.  

## 2015-02-17 NOTE — Telephone Encounter (Signed)
Pt called today saying that she had just called the lab and they told her that her results were ready. The lab called a few minutes ago and spoke with Misty StanleyStacey regarding this patient. Patient said that lab was going to call and that Misty StanleyStacey would have the results.  I told patient (again) that once the results were available the doctor would review and sign off and the nurse would be calling her with the results and it is normally 7-10 business days. She said that she's had this done before and it's never been this long. I was not getting anywhere with the patient and transferred call to CM.

## 2015-02-17 NOTE — Telephone Encounter (Signed)
These have been reviewed and result note made with request for patient communication. See other phone note and lab notes for details.

## 2015-02-17 NOTE — Telephone Encounter (Signed)
Patient was having diarrhea for about 6 days, she went to see her pcp, Dr Dimas AguasHoward and he gave her an Antibiotin and Gas-X.  She has not had any diarrhea since Wednesday night.    She stated she did have some abdominal tenderness.   Patient states she does not have any fever or bloody stools.   Her stomach is making a gurgling sounds, no cramps, unable to eat anything other than items off the brat diet.

## 2015-02-17 NOTE — Telephone Encounter (Signed)
Per Verlon AuLeslie, this is Eric's pt and I am routing to him and also telling him about it.

## 2015-02-17 NOTE — Telephone Encounter (Signed)
Pt is aware of results. 

## 2015-02-17 NOTE — Telephone Encounter (Signed)
I addressed acute issues while Minerva Areolaric was out, see multiple phone notes, (recently seen by him in the office). Results came back yesterday and [facility].  Forwarded to Minerva AreolaEric to defer next step in plan.  I did not have access to her CT report (went to [employer] ordering provider for review) done at outside facility.

## 2015-02-17 NOTE — Telephone Encounter (Signed)
This has been addressed. Stool for CDiff negative, CT negative. Patient appears to have seen her PCP and started on antibiotics.

## 2015-02-17 NOTE — Assessment & Plan Note (Addendum)
54 year old female with a history of multiple episodes of acute diverticulitis, however only to have been documented on CT scan, while the rest were treated with antibiotics. Patient with moderate to severe left lower quadrant abdominal pain, although she denies fever and chills, recent weight loss, intractable nausea vomiting, and other red flag/warning signs symptoms. No rebound. Today I will order a CBC, CMP, and lipase as well as a stat CT of the abdomen and pelvis with contrast to evaluate for possible acute diverticulitis. Patient is requesting a CT be done in [place] due to insurance and cost issues and we will arrange for this as requested. We'll call with the results of her labs and CT imaging and make further plans based on this. Patient instructed to call us on Monday to let us now she is doing. Return for routine follow-up in 6-8 weeks, or sooner if needed.

## 2015-02-17 NOTE — Telephone Encounter (Signed)
LAB CALLED STATING THAT PATIENT WAS CONTACTING THEM BECAUSE WE DID NOT HAVE LAB RESULTS. STATED THAT THEY WOULD SEND RESULTS TO US AGAIN

## 2015-02-17 NOTE — Telephone Encounter (Signed)
Her C. Diff is negative. Continue with the antibiotics her PCP put her on.

## 2015-02-17 NOTE — Assessment & Plan Note (Addendum)
53-year-old female with a history of multiple episodes of acute diverticulitis, however only to have been documented on CT scan, while the rest were treated with antibiotics. Patient with moderate to severe left lower quadrant abdominal pain, although she denies fever and chills, recent weight loss, intractable nausea vomiting, and other red flag/warning signs symptoms. No rebound. Today I will order a CBC, CMP, and lipase as well as a stat CT of the abdomen and pelvis with contrast to evaluate for possible acute diverticulitis. Patient is requesting a CT be done in [place] due to insurance and cost issues and we will arrange for this as requested. We'll call with the results of her labs and CT imaging and make further plans based on this. Patient instructed to call us on Monday to let us now she is doing. Return for routine follow-up in 6-8 weeks, or sooner if needed.  

## 2015-02-20 NOTE — Telephone Encounter (Signed)
I called pt and LMOM that her CT was negative. ( See separate note, she was told about the C-Diff on Friday).

## 2015-02-23 NOTE — Progress Notes (Signed)
cc'ed to pcp °

## 2016-06-17 ENCOUNTER — Encounter (INDEPENDENT_AMBULATORY_CARE_PROVIDER_SITE_OTHER): Payer: Self-pay

## 2016-06-17 ENCOUNTER — Encounter (INDEPENDENT_AMBULATORY_CARE_PROVIDER_SITE_OTHER): Payer: Self-pay | Admitting: Internal Medicine

## 2016-07-03 ENCOUNTER — Ambulatory Visit (INDEPENDENT_AMBULATORY_CARE_PROVIDER_SITE_OTHER): Payer: 59 | Admitting: Internal Medicine

## 2016-08-27 ENCOUNTER — Encounter (INDEPENDENT_AMBULATORY_CARE_PROVIDER_SITE_OTHER): Payer: Self-pay | Admitting: *Deleted

## 2016-08-27 ENCOUNTER — Encounter (INDEPENDENT_AMBULATORY_CARE_PROVIDER_SITE_OTHER): Payer: Self-pay | Admitting: Internal Medicine

## 2016-08-27 ENCOUNTER — Other Ambulatory Visit (INDEPENDENT_AMBULATORY_CARE_PROVIDER_SITE_OTHER): Payer: Self-pay | Admitting: Internal Medicine

## 2016-08-27 ENCOUNTER — Telehealth (INDEPENDENT_AMBULATORY_CARE_PROVIDER_SITE_OTHER): Payer: Self-pay | Admitting: *Deleted

## 2016-08-27 ENCOUNTER — Ambulatory Visit (INDEPENDENT_AMBULATORY_CARE_PROVIDER_SITE_OTHER): Payer: 59 | Admitting: Internal Medicine

## 2016-08-27 VITALS — BP 116/70 | HR 72 | Temp 97.7°F | Ht 59.0 in | Wt 144.9 lb

## 2016-08-27 DIAGNOSIS — Z800 Family history of malignant neoplasm of digestive organs: Secondary | ICD-10-CM

## 2016-08-27 DIAGNOSIS — K5732 Diverticulitis of large intestine without perforation or abscess without bleeding: Secondary | ICD-10-CM | POA: Diagnosis not present

## 2016-08-27 MED ORDER — PEG 3350-KCL-NA BICARB-NACL 420 G PO SOLR: 4000.0000 mL | Freq: Once | ORAL | 0 refills | Status: AC

## 2016-08-27 NOTE — Patient Instructions (Signed)
Colonoscopy.  The risks and benefits such as perforation, bleeding, and infection were reviewed with the patient and is agreeable. 

## 2016-08-27 NOTE — Progress Notes (Signed)
Subjective:    Patient ID: Jennifer Suarez, female    DOB: 02/10/1961, 55 y.o.   MRN: [id number]  HPI Referred by Dr. Dimas Aguas for diverticulitis. Has seen [facility] (2016) for same. Her last colonoscopy was in 2012 by Dr. Jena Gauss which showed left sided diverticulosis.  Diagnosed with diverticulitis in 2006. She occasionally has flares of diverticulitis.  She thinks she may have had 8-9 bouts of diverticulitis in the past.  She tells me she burps daily.  She says a couple of weeks ago she has some tenderness in her LLQ.  She has been evaluated [facility] for possible surgery for her diverticulitis, but surgery was dffered.  She thinks her last flare of diverticulitis was less than a year. There is no pain today.  She does have some belching and burping.  Acid reflux better with Omeprazole and Zantac.   Hx of C diff in the past.   Brother has recent hx of colon cancer in July.    02/09/2015 CT abdomen/pelvis with CM: possible diverticulitis. No acute process in the abdomen/pelvis.    Review of Systems Past Medical History:  Diagnosis Date  . Clostridium difficile colitis 08/2011  . Diverticulitis    recurrent, treated with antibiotics 9 times since 2006. CT positive 04/2005, 07/2011, 08/2011  . Hyperlipidemia   . Hypertension   . Irritable bowel syndrome     Past Surgical History:  Procedure Laterality Date  . COLONOSCOPY  2007   Dr. Bing Plume to moderate sigmioid diverticulosis with no evidence for diverticulitis  . COLONOSCOPY  10/08/2011   TKZ:[id number] sided colonic diverticulosis  . exploratory laparoscopy     age 89  . NASAL SEPTUM SURGERY      Allergies  Allergen Reactions  . Flagyl [Metronidazole Hcl] Other (See Comments)    Facial swelling while on cipro and flagyl  . Nitrofurantoin Monohyd Macro Other (See Comments)    Patient not sure of allergic reaction.   . Sulfa Antibiotics Other (See Comments)    Patient unsure of the reaction.    .  Ciprofloxacin Swelling    Facial swelling while on cipro and flagyl    Current Outpatient Prescriptions on File Prior to Visit  Medication Sig Dispense Refill  . ALPRAZolam (XANAX) 0.25 MG tablet Take 0.25 mg by mouth daily as needed. Anxiety    . atorvastatin (LIPITOR) 20 MG tablet     . hydrochlorothiazide (MICROZIDE) 12.5 MG capsule Take 125 mg by mouth daily.     . metFORMIN (GLUCOPHAGE-XR) 500 MG 24 hr tablet Take 500 mg by mouth daily with breakfast.     . Probiotic Product (ALIGN) 4 MG CAPS Take 4 mg by mouth daily. 30 capsule 0  . hyoscyamine (ANASPAZ) 0.125 MG TBDP Place 0.125 mg under the tongue every 4 (four) hours as needed. Relax Intestine     No current facility-administered medications on file prior to visit.        Objective:   Physical Exam Blood pressure 116/70, pulse 72, temperature 97.7 F (36.5 C), height 4\' 11"  (1.499 m), weight 144 lb 14.4 oz (65.7 kg). Alert and oriented. Skin warm and dry. Oral mucosa is moist.   . Sclera anicteric, conjunctivae is pink. Thyroid not enlarged. No cervical lymphadenopathy. Lungs clear. Heart regular rate and rhythm.  Abdomen is soft. Bowel sounds are positive. No hepatomegaly. No abdominal masses felt. No tenderness.  No edema to lower extremities.         Assessment & Plan:  Hx of diverticulitis. Family hx colon cancer. Colonoscopy. The risks and benefits such as perforation, bleeding, and infection were reviewed with the patient and is agreeable. May u Gaviscon as needed.

## 2016-08-27 NOTE — Telephone Encounter (Signed)
Patient needs trilyte 

## 2016-08-28 DIAGNOSIS — Z800 Family history of malignant neoplasm of digestive organs: Secondary | ICD-10-CM | POA: Insufficient documentation

## 2016-08-28 MED ORDER — PEG 3350-KCL-NA BICARB-NACL 420 G PO SOLR: 4000.0000 mL | Freq: Once | ORAL | 0 refills | Status: AC

## 2016-08-28 NOTE — Addendum Note (Signed)
Addended by: Len BlalockSETZER, TERRI L on: 08/28/2016 04:33 PM   Modules accepted: Orders

## 2016-08-28 NOTE — Addendum Note (Signed)
Addended by: Simone CuriaILLEY, Lilyonna Steidle H on: 08/28/2016 04:22 PM   Modules accepted: Orders

## 2016-08-28 NOTE — Telephone Encounter (Signed)
[name] this order needs to be corrected

## 2016-09-20 ENCOUNTER — Encounter (INDEPENDENT_AMBULATORY_CARE_PROVIDER_SITE_OTHER): Payer: Self-pay

## 2016-10-31 ENCOUNTER — Encounter (INDEPENDENT_AMBULATORY_CARE_PROVIDER_SITE_OTHER): Payer: Self-pay | Admitting: *Deleted

## 2016-12-11 ENCOUNTER — Ambulatory Visit (INDEPENDENT_AMBULATORY_CARE_PROVIDER_SITE_OTHER): Payer: 59 | Admitting: Internal Medicine

## 2016-12-11 ENCOUNTER — Encounter (INDEPENDENT_AMBULATORY_CARE_PROVIDER_SITE_OTHER): Payer: Self-pay | Admitting: Internal Medicine

## 2016-12-11 VITALS — BP 136/80 | HR 72 | Temp 98.1°F | Ht 59.0 in | Wt 145.8 lb

## 2016-12-11 DIAGNOSIS — R197 Diarrhea, unspecified: Secondary | ICD-10-CM

## 2016-12-11 NOTE — Patient Instructions (Signed)
GI pathogen Imodium am and Pm.

## 2016-12-11 NOTE — Progress Notes (Signed)
Subjective:    Patient ID: Jennifer Suarez, female    DOB: May 16, 1961, 56 y.o.   MRN: [id number]  HPI Presents today with c/o diarrhea. Scheduled for a colonoscopy 01/01/2017.  She thinks she may have C-diff.  She says she over the past 2 weeks, she has had diarrhea and nausea. Stomach feels like it is rolling.  She is having averaging maybe 15 stools a day. Her appetite is fair. No weight loss. No melena or BRRB.  No fever.    Scheduled for a colonoscopy this month.    Hx of c-diff.   Hx of diverticulitis.  Family hx of colon cancer in a brother. Review of Systems Past Medical History:  Diagnosis Date  . Clostridium difficile colitis 08/2011  . Diverticulitis    recurrent, treated with antibiotics 9 times since 2006. CT positive 04/2005, 07/2011, 08/2011  . Hyperlipidemia   . Hypertension   . Irritable bowel syndrome     Past Surgical History:  Procedure Laterality Date  . COLONOSCOPY  2007   Dr. Bing PlumeLandon Weeks-->mild to moderate sigmioid diverticulosis with no evidence for diverticulitis  . COLONOSCOPY  10/08/2011   [id number]:left sided colonic diverticulosis  . exploratory laparoscopy     age 56  . NASAL SEPTUM SURGERY      Allergies  Allergen Reactions  . Flagyl [Metronidazole Hcl] Other (See Comments)    Facial swelling while on cipro and flagyl  . Nitrofurantoin Monohyd Macro Other (See Comments)    Patient not sure of allergic reaction.   . Sulfa Antibiotics Other (See Comments)    Patient unsure of the reaction.    . Ciprofloxacin Swelling    Facial swelling while on cipro and flagyl    Current Outpatient Prescriptions on File Prior to Visit  Medication Sig Dispense Refill  . ALPRAZolam (XANAX) 0.25 MG tablet Take 0.25 mg by mouth daily as needed. Anxiety    . aspirin EC 81 MG tablet Take 81 mg by mouth daily.    Marland Kitchen. atorvastatin (LIPITOR) 20 MG tablet     . hydrochlorothiazide (MICROZIDE) 12.5 MG capsule Take 125 mg by mouth daily.     . hyoscyamine  (ANASPAZ) 0.125 MG TBDP Place 0.125 mg under the tongue every 4 (four) hours as needed. Relax Intestine    . lisinopril (PRINIVIL,ZESTRIL) 5 MG tablet Take 5 mg by mouth daily.    . metFORMIN (GLUCOPHAGE-XR) 500 MG 24 hr tablet Take 500 mg by mouth daily with breakfast.     . metoprolol succinate (TOPROL-XL) 25 MG 24 hr tablet Take 25 mg by mouth daily.    Marland Kitchen. omeprazole (PRILOSEC) 20 MG capsule Take 20 mg by mouth daily.    . Probiotic Product (ALIGN) 4 MG CAPS Take 4 mg by mouth daily. 30 capsule 0  . promethazine (PHENERGAN) 25 MG tablet Take 25 mg by mouth every 6 (six) hours as needed for nausea or vomiting.    . ranitidine (ZANTAC) 150 MG capsule Take 150 mg by mouth 2 (two) times daily.     No current facility-administered medications on file prior to visit.        Objective:   Physical Exam Blood pressure 136/80, pulse 72, temperature 98.1 F (36.7 C), height 4\' 11"  (1.499 m), weight 145 lb 12.8 oz (66.1 kg). Alert and oriented. Skin warm and dry. Oral mucosa is moist.   . Sclera anicteric, conjunctivae is pink. Thyroid not enlarged. No cervical lymphadenopathy. Lungs clear. Heart regular rate and rhythm.  Abdomen is  soft. Bowel sounds are positive. No hepatomegaly. No abdominal masses felt. No tenderness.  No edema to lower extremities.          Assessment & Plan:  Diarrhea. Hx c-dif in the past. Per records she is allergic to Cipro and Flagyl. GI pathogen.  Imodium am and pm.

## 2016-12-12 DIAGNOSIS — A084 Viral intestinal infection, unspecified: Secondary | ICD-10-CM | POA: Diagnosis not present

## 2016-12-15 DIAGNOSIS — R1084 Generalized abdominal pain: Secondary | ICD-10-CM | POA: Diagnosis not present

## 2016-12-15 DIAGNOSIS — K5790 Diverticulosis of intestine, part unspecified, without perforation or abscess without bleeding: Secondary | ICD-10-CM | POA: Diagnosis not present

## 2016-12-15 DIAGNOSIS — D72829 Elevated white blood cell count, unspecified: Secondary | ICD-10-CM | POA: Diagnosis not present

## 2016-12-15 DIAGNOSIS — R1032 Left lower quadrant pain: Secondary | ICD-10-CM | POA: Diagnosis not present

## 2016-12-15 DIAGNOSIS — K5732 Diverticulitis of large intestine without perforation or abscess without bleeding: Secondary | ICD-10-CM | POA: Diagnosis not present

## 2016-12-16 ENCOUNTER — Telehealth (INDEPENDENT_AMBULATORY_CARE_PROVIDER_SITE_OTHER): Payer: Self-pay | Admitting: Internal Medicine

## 2016-12-16 ENCOUNTER — Encounter (INDEPENDENT_AMBULATORY_CARE_PROVIDER_SITE_OTHER): Payer: Self-pay | Admitting: *Deleted

## 2016-12-16 NOTE — Telephone Encounter (Signed)
I have talked with patient. [facility]. Will put off colonoscopy for 6 weeks

## 2016-12-16 NOTE — Telephone Encounter (Signed)
Patient called, stated that see was seen here last week for abdominal pain.  She wanted to let Terri know that she ended up at the Emergency room and she was given a confirmed diagnosis of diverticulitis after a CT was done.  She also wants the results of her c-diff.  [phone]

## 2016-12-16 NOTE — Telephone Encounter (Signed)
TCS sch'd to 01/29/17, patient aware, new instructions mailed

## 2016-12-16 NOTE — Telephone Encounter (Signed)
[name], put Jennifer Suarez's colonoscopy out 6 weeks. She has diverticulitis

## 2016-12-17 ENCOUNTER — Encounter (INDEPENDENT_AMBULATORY_CARE_PROVIDER_SITE_OTHER): Payer: Self-pay | Admitting: Internal Medicine

## 2016-12-17 ENCOUNTER — Telehealth (INDEPENDENT_AMBULATORY_CARE_PROVIDER_SITE_OTHER): Payer: Self-pay | Admitting: Internal Medicine

## 2016-12-17 DIAGNOSIS — Z1231 Encounter for screening mammogram for malignant neoplasm of breast: Secondary | ICD-10-CM | POA: Diagnosis not present

## 2016-12-17 LAB — GASTROINTESTINAL PATHOGEN PANEL PCR
C. difficile Tox A/B, PCR: NOT DETECTED
CAMPYLOBACTER, PCR: NOT DETECTED
Cryptosporidium, PCR: NOT DETECTED
E coli (ETEC) LT/ST PCR: NOT DETECTED
E coli (STEC) stx1/stx2, PCR: NOT DETECTED
E coli 0157, PCR: NOT DETECTED
GIARDIA LAMBLIA, PCR: NOT DETECTED
NOROVIRUS, PCR: NOT DETECTED
Rotavirus A, PCR: NOT DETECTED
SHIGELLA, PCR: NOT DETECTED
Salmonella, PCR: NOT DETECTED

## 2016-12-17 NOTE — Telephone Encounter (Signed)
Patient called, stated she spoke with Terri yesterday and that Jennifer Suarez would give her a work note, she needs one for today, ok to give?  She would like it faxed.  She stated that she still would like to know the results of the c-dif test.  Fax to [phone] Phone # [phone]

## 2016-12-17 NOTE — Telephone Encounter (Signed)
Please send her a work note

## 2016-12-17 NOTE — Telephone Encounter (Signed)
A letter was typed, signed by Camelia Engerri and faxed to the number that the patient left.

## 2016-12-19 NOTE — Telephone Encounter (Signed)
error 

## 2016-12-20 DIAGNOSIS — E1143 Type 2 diabetes mellitus with diabetic autonomic (poly)neuropathy: Secondary | ICD-10-CM | POA: Diagnosis not present

## 2016-12-20 DIAGNOSIS — E7800 Pure hypercholesterolemia, unspecified: Secondary | ICD-10-CM | POA: Diagnosis not present

## 2016-12-20 DIAGNOSIS — I10 Essential (primary) hypertension: Secondary | ICD-10-CM | POA: Diagnosis not present

## 2016-12-20 DIAGNOSIS — E1165 Type 2 diabetes mellitus with hyperglycemia: Secondary | ICD-10-CM | POA: Diagnosis not present

## 2016-12-23 DIAGNOSIS — L509 Urticaria, unspecified: Secondary | ICD-10-CM | POA: Diagnosis not present

## 2016-12-24 DIAGNOSIS — R197 Diarrhea, unspecified: Secondary | ICD-10-CM | POA: Diagnosis not present

## 2016-12-24 DIAGNOSIS — R5383 Other fatigue: Secondary | ICD-10-CM | POA: Diagnosis not present

## 2017-01-29 ENCOUNTER — Encounter (HOSPITAL_COMMUNITY): Admission: RE | Payer: Self-pay | Source: Ambulatory Visit

## 2017-01-29 ENCOUNTER — Ambulatory Visit (HOSPITAL_COMMUNITY): Admission: RE | Admit: 2017-01-29 | Payer: 59 | Source: Ambulatory Visit | Admitting: Internal Medicine

## 2017-01-29 SURGERY — COLONOSCOPY
Anesthesia: Moderate Sedation

## 2017-02-05 DIAGNOSIS — J209 Acute bronchitis, unspecified: Secondary | ICD-10-CM | POA: Diagnosis not present

## 2017-04-19 DIAGNOSIS — K5732 Diverticulitis of large intestine without perforation or abscess without bleeding: Secondary | ICD-10-CM | POA: Diagnosis not present

## 2017-04-23 DIAGNOSIS — R1032 Left lower quadrant pain: Secondary | ICD-10-CM | POA: Diagnosis not present

## 2017-04-23 DIAGNOSIS — D72829 Elevated white blood cell count, unspecified: Secondary | ICD-10-CM | POA: Diagnosis not present

## 2017-04-23 DIAGNOSIS — K5732 Diverticulitis of large intestine without perforation or abscess without bleeding: Secondary | ICD-10-CM | POA: Diagnosis not present

## 2017-04-24 DIAGNOSIS — K5732 Diverticulitis of large intestine without perforation or abscess without bleeding: Secondary | ICD-10-CM | POA: Diagnosis not present

## 2017-04-25 DIAGNOSIS — K5732 Diverticulitis of large intestine without perforation or abscess without bleeding: Secondary | ICD-10-CM | POA: Diagnosis not present

## 2017-04-29 DIAGNOSIS — K5792 Diverticulitis of intestine, part unspecified, without perforation or abscess without bleeding: Secondary | ICD-10-CM | POA: Diagnosis not present

## 2017-05-08 DIAGNOSIS — R110 Nausea: Secondary | ICD-10-CM | POA: Diagnosis not present

## 2017-05-08 DIAGNOSIS — R1032 Left lower quadrant pain: Secondary | ICD-10-CM | POA: Diagnosis not present

## 2017-05-08 DIAGNOSIS — K5732 Diverticulitis of large intestine without perforation or abscess without bleeding: Secondary | ICD-10-CM | POA: Diagnosis not present

## 2017-05-13 DIAGNOSIS — K5720 Diverticulitis of large intestine with perforation and abscess without bleeding: Secondary | ICD-10-CM | POA: Diagnosis not present

## 2017-05-25 DIAGNOSIS — R110 Nausea: Secondary | ICD-10-CM | POA: Diagnosis not present

## 2017-05-25 DIAGNOSIS — R1032 Left lower quadrant pain: Secondary | ICD-10-CM | POA: Diagnosis not present

## 2017-05-25 DIAGNOSIS — K5732 Diverticulitis of large intestine without perforation or abscess without bleeding: Secondary | ICD-10-CM | POA: Diagnosis not present

## 2017-06-10 DIAGNOSIS — N950 Postmenopausal bleeding: Secondary | ICD-10-CM | POA: Diagnosis not present

## 2017-06-10 DIAGNOSIS — Z124 Encounter for screening for malignant neoplasm of cervix: Secondary | ICD-10-CM | POA: Diagnosis not present

## 2017-06-13 DIAGNOSIS — R938 Abnormal findings on diagnostic imaging of other specified body structures: Secondary | ICD-10-CM | POA: Diagnosis not present

## 2017-06-13 DIAGNOSIS — Z124 Encounter for screening for malignant neoplasm of cervix: Secondary | ICD-10-CM | POA: Diagnosis not present

## 2017-06-13 DIAGNOSIS — N950 Postmenopausal bleeding: Secondary | ICD-10-CM | POA: Diagnosis not present

## 2017-06-19 DIAGNOSIS — N950 Postmenopausal bleeding: Secondary | ICD-10-CM | POA: Diagnosis not present

## 2017-06-19 DIAGNOSIS — R938 Abnormal findings on diagnostic imaging of other specified body structures: Secondary | ICD-10-CM | POA: Diagnosis not present

## 2017-06-24 DIAGNOSIS — N950 Postmenopausal bleeding: Secondary | ICD-10-CM | POA: Diagnosis not present

## 2017-06-24 DIAGNOSIS — K5720 Diverticulitis of large intestine with perforation and abscess without bleeding: Secondary | ICD-10-CM | POA: Diagnosis not present

## 2017-06-24 DIAGNOSIS — Z800 Family history of malignant neoplasm of digestive organs: Secondary | ICD-10-CM | POA: Diagnosis not present

## 2017-06-25 DIAGNOSIS — K5289 Other specified noninfective gastroenteritis and colitis: Secondary | ICD-10-CM | POA: Diagnosis not present

## 2017-06-25 DIAGNOSIS — K6289 Other specified diseases of anus and rectum: Secondary | ICD-10-CM | POA: Diagnosis not present

## 2017-06-25 DIAGNOSIS — K5140 Inflammatory polyps of colon without complications: Secondary | ICD-10-CM | POA: Diagnosis not present

## 2017-06-25 DIAGNOSIS — Z09 Encounter for follow-up examination after completed treatment for conditions other than malignant neoplasm: Secondary | ICD-10-CM | POA: Diagnosis not present

## 2017-06-25 DIAGNOSIS — K5730 Diverticulosis of large intestine without perforation or abscess without bleeding: Secondary | ICD-10-CM | POA: Diagnosis not present

## 2017-06-25 DIAGNOSIS — K621 Rectal polyp: Secondary | ICD-10-CM | POA: Diagnosis not present

## 2017-06-25 DIAGNOSIS — K635 Polyp of colon: Secondary | ICD-10-CM | POA: Diagnosis not present

## 2017-06-27 DIAGNOSIS — N950 Postmenopausal bleeding: Secondary | ICD-10-CM | POA: Diagnosis not present

## 2017-06-27 DIAGNOSIS — R938 Abnormal findings on diagnostic imaging of other specified body structures: Secondary | ICD-10-CM | POA: Diagnosis not present

## 2017-07-09 DIAGNOSIS — J069 Acute upper respiratory infection, unspecified: Secondary | ICD-10-CM | POA: Diagnosis not present

## 2017-07-15 DIAGNOSIS — E1165 Type 2 diabetes mellitus with hyperglycemia: Secondary | ICD-10-CM | POA: Diagnosis not present

## 2017-07-15 DIAGNOSIS — I10 Essential (primary) hypertension: Secondary | ICD-10-CM | POA: Diagnosis not present

## 2017-07-15 DIAGNOSIS — E7800 Pure hypercholesterolemia, unspecified: Secondary | ICD-10-CM | POA: Diagnosis not present

## 2017-07-17 DIAGNOSIS — E7800 Pure hypercholesterolemia, unspecified: Secondary | ICD-10-CM | POA: Diagnosis not present

## 2017-07-17 DIAGNOSIS — E1165 Type 2 diabetes mellitus with hyperglycemia: Secondary | ICD-10-CM | POA: Diagnosis not present

## 2017-08-08 DIAGNOSIS — N950 Postmenopausal bleeding: Secondary | ICD-10-CM | POA: Diagnosis not present

## 2017-08-12 DIAGNOSIS — K5720 Diverticulitis of large intestine with perforation and abscess without bleeding: Secondary | ICD-10-CM | POA: Diagnosis not present

## 2017-08-20 DIAGNOSIS — N950 Postmenopausal bleeding: Secondary | ICD-10-CM | POA: Diagnosis not present

## 2017-08-26 DIAGNOSIS — Z23 Encounter for immunization: Secondary | ICD-10-CM | POA: Diagnosis not present

## 2017-09-18 DIAGNOSIS — J019 Acute sinusitis, unspecified: Secondary | ICD-10-CM | POA: Diagnosis not present

## 2017-09-18 DIAGNOSIS — H6691 Otitis media, unspecified, right ear: Secondary | ICD-10-CM | POA: Diagnosis not present

## 2017-10-07 DIAGNOSIS — K5720 Diverticulitis of large intestine with perforation and abscess without bleeding: Secondary | ICD-10-CM | POA: Diagnosis not present

## 2017-10-07 DIAGNOSIS — K5792 Diverticulitis of intestine, part unspecified, without perforation or abscess without bleeding: Secondary | ICD-10-CM | POA: Diagnosis not present

## 2017-10-10 DIAGNOSIS — N950 Postmenopausal bleeding: Secondary | ICD-10-CM | POA: Diagnosis not present

## 2017-10-10 DIAGNOSIS — K5732 Diverticulitis of large intestine without perforation or abscess without bleeding: Secondary | ICD-10-CM | POA: Diagnosis not present

## 2017-10-10 DIAGNOSIS — K5730 Diverticulosis of large intestine without perforation or abscess without bleeding: Secondary | ICD-10-CM | POA: Diagnosis not present

## 2017-10-10 DIAGNOSIS — N840 Polyp of corpus uteri: Secondary | ICD-10-CM | POA: Diagnosis not present

## 2017-10-10 DIAGNOSIS — K5720 Diverticulitis of large intestine with perforation and abscess without bleeding: Secondary | ICD-10-CM | POA: Diagnosis not present

## 2017-10-10 DIAGNOSIS — G8918 Other acute postprocedural pain: Secondary | ICD-10-CM | POA: Diagnosis not present

## 2017-10-10 HISTORY — PX: OTHER SURGICAL HISTORY: SHX169

## 2017-10-10 HISTORY — PX: DILATION AND CURETTAGE OF UTERUS: SHX78

## 2017-10-10 HISTORY — PX: HYSTEROSCOPY: SHX211

## 2017-10-13 MED ORDER — ONDANSETRON 4 MG PO TBDP
4.00 mg | ORAL_TABLET | ORAL | Status: DC
Start: ? — End: 2017-10-13

## 2017-10-13 MED ORDER — DEXTROSE 10 % IV SOLN
125.00 mL | INTRAVENOUS | Status: DC
Start: ? — End: 2017-10-13

## 2017-10-13 MED ORDER — ENOXAPARIN SODIUM 40 MG/0.4ML ~~LOC~~ SOLN
40.00 mg | SUBCUTANEOUS | Status: DC
Start: 2017-10-14 — End: 2017-10-13

## 2017-10-13 MED ORDER — INSULIN LISPRO 100 UNIT/ML ~~LOC~~ SOLN
1.00 | SUBCUTANEOUS | Status: DC
Start: 2017-10-13 — End: 2017-10-13

## 2017-10-13 MED ORDER — OXYCODONE HCL 5 MG PO TABS
5.00 mg | ORAL_TABLET | ORAL | Status: DC
Start: ? — End: 2017-10-13

## 2017-10-13 MED ORDER — DIPHENHYDRAMINE HCL 25 MG PO CAPS
25.00 mg | ORAL_CAPSULE | ORAL | Status: DC
Start: ? — End: 2017-10-13

## 2017-10-13 MED ORDER — SODIUM CHLORIDE 0.9 % IV SOLN
INTRAVENOUS | Status: DC
Start: ? — End: 2017-10-13

## 2017-10-13 MED ORDER — SODIUM CHLORIDE 0.65 % NA SOLN
1.00 | NASAL | Status: DC
Start: ? — End: 2017-10-13

## 2017-10-13 MED ORDER — ACETAMINOPHEN 500 MG PO TABS
1000.00 mg | ORAL_TABLET | ORAL | Status: DC
Start: 2017-10-13 — End: 2017-10-13

## 2017-10-29 DIAGNOSIS — R05 Cough: Secondary | ICD-10-CM | POA: Diagnosis not present

## 2017-10-29 DIAGNOSIS — J069 Acute upper respiratory infection, unspecified: Secondary | ICD-10-CM | POA: Diagnosis not present

## 2018-03-09 DIAGNOSIS — Z0001 Encounter for general adult medical examination with abnormal findings: Secondary | ICD-10-CM | POA: Diagnosis not present

## 2018-03-12 DIAGNOSIS — Z0000 Encounter for general adult medical examination without abnormal findings: Secondary | ICD-10-CM | POA: Diagnosis not present

## 2018-05-18 DIAGNOSIS — K5732 Diverticulitis of large intestine without perforation or abscess without bleeding: Secondary | ICD-10-CM | POA: Diagnosis not present

## 2018-05-18 DIAGNOSIS — K580 Irritable bowel syndrome with diarrhea: Secondary | ICD-10-CM | POA: Diagnosis not present

## 2018-06-11 DIAGNOSIS — J019 Acute sinusitis, unspecified: Secondary | ICD-10-CM | POA: Diagnosis not present

## 2018-06-11 DIAGNOSIS — J301 Allergic rhinitis due to pollen: Secondary | ICD-10-CM | POA: Diagnosis not present

## 2018-09-14 DIAGNOSIS — I10 Essential (primary) hypertension: Secondary | ICD-10-CM | POA: Diagnosis not present

## 2018-09-14 DIAGNOSIS — E1143 Type 2 diabetes mellitus with diabetic autonomic (poly)neuropathy: Secondary | ICD-10-CM | POA: Diagnosis not present

## 2018-09-14 DIAGNOSIS — E1165 Type 2 diabetes mellitus with hyperglycemia: Secondary | ICD-10-CM | POA: Diagnosis not present

## 2018-09-16 DIAGNOSIS — Z23 Encounter for immunization: Secondary | ICD-10-CM | POA: Diagnosis not present

## 2018-09-16 DIAGNOSIS — Z6828 Body mass index (BMI) 28.0-28.9, adult: Secondary | ICD-10-CM | POA: Diagnosis not present

## 2018-09-16 DIAGNOSIS — I10 Essential (primary) hypertension: Secondary | ICD-10-CM | POA: Diagnosis not present

## 2018-09-16 DIAGNOSIS — E1165 Type 2 diabetes mellitus with hyperglycemia: Secondary | ICD-10-CM | POA: Diagnosis not present

## 2020-05-29 ENCOUNTER — Telehealth: Payer: Self-pay | Admitting: Allergy & Immunology

## 2020-05-29 NOTE — Telephone Encounter (Signed)
Jennifer Suarez wants to talk with some one about the covid shot. She wants to get it injection but has some question about it. [phone]

## 2020-05-29 NOTE — Telephone Encounter (Signed)
Called and spoke to patient and she states she wants to get the vaccine but she's very nervous to get it as her aunt had passed 6 days after receiving her 2nd vaccine. I asked if patient had any reactions to any vaccines and she stated she hasn't only antibiotics. She couldn't name them but stated it was for diverticulitis. I explained to patients that we only do the testing if someone has had a reaction to the first one or if they are allergic to other vaccines. Patient expressed understanding but wanted me to ask.

## 2020-05-29 NOTE — Telephone Encounter (Signed)
Patient called back. If someone could give her a call back please.  Thanks

## 2020-05-29 NOTE — Telephone Encounter (Signed)
Left message for patient to call back  

## 2020-05-31 NOTE — Telephone Encounter (Signed)
That is correct.  At her age, she also should have had a colonoscopy.  Most bowel preps contain polyethylene glycol, so she has had a colonoscopy she should be good to go.  We are hoping to be able to give the vaccine in our clinic in the next couple of weeks.  If she would like to get the vaccine in our clinic, we can certainly call her back when you get approved for that.  It should definitely be mid August.  Malachi Bonds, MD [facility]

## 2020-05-31 NOTE — Telephone Encounter (Signed)
Patient agrees with doing the COVID challenge and would like to get on the list please contact patient to schedule when ready.

## 2020-06-12 NOTE — Telephone Encounter (Signed)
Just to clarify, do you want her to be tested or just signed up to receive first dose of vaccine?

## 2020-06-13 NOTE — Telephone Encounter (Signed)
I just want to schedule her to receive the first dose.  She does not need component testing.  Malachi Bonds, MD [facility]

## 2020-06-23 ENCOUNTER — Other Ambulatory Visit: Payer: Self-pay

## 2020-06-23 ENCOUNTER — Ambulatory Visit (INDEPENDENT_AMBULATORY_CARE_PROVIDER_SITE_OTHER): Payer: BC Managed Care – PPO | Admitting: Allergy & Immunology

## 2020-06-23 ENCOUNTER — Encounter: Payer: Self-pay | Admitting: Allergy & Immunology

## 2020-06-23 VITALS — BP 138/80 | HR 70 | Temp 98.6°F | Resp 18 | Wt 132.8 lb

## 2020-06-23 DIAGNOSIS — Z7189 Other specified counseling: Secondary | ICD-10-CM | POA: Diagnosis not present

## 2020-06-23 DIAGNOSIS — T50Z95D Adverse effect of other vaccines and biological substances, subsequent encounter: Secondary | ICD-10-CM | POA: Diagnosis not present

## 2020-06-23 DIAGNOSIS — Z889 Allergy status to unspecified drugs, medicaments and biological substances status: Secondary | ICD-10-CM

## 2020-06-23 DIAGNOSIS — Z7185 Encounter for immunization safety counseling: Secondary | ICD-10-CM

## 2020-06-23 NOTE — Progress Notes (Signed)
NEW PATIENT  Date of Service/Encounter:  06/23/20  Referring provider: Selinda Flavin, MD   Assessment:   Concern for vaccine adverse reaction  Multiple drug allergies  Vaccine counseling   Jennifer Suarez presents for evaluation of multiple antibiotic allergies as well as concerns with the COVID-19 vaccine.  It seems that her experiences with the COVID-19 vaccine are centered on an aunt who passed away within days of getting the vaccine at the age of 27.  It is not clear why she passed away.  While Jennifer Suarez reports that it was blood clots, it seems that she never had any kind of autopsy performed.  So the details are a little bit hazy.  There was another friend of her friend who passed away from blood clots after the vaccine as well, but again the details are hazy.  I did explain to her that this was the first vaccine where we preferentially gave it to senior citizens since they were the ones who are dying from the disease.  However, this age group tends to die anyway, making it difficult to tease out whether the deaths were incidental or secondary to the vaccine.  I did reassure her that all of these deaths were put into a database and that epidemiologist look for patterns amongst these.  This was how the rare blood clotting disorder associated with the [employer] vaccine was elucidated.  This did make her feel better, but she would like to get it under more close supervision.  Therefore, we will schedule her for a graded vaccine administration on August 30.  Plan/Recommendations:   1. Multiple antibiotic allergies - We will schedule you for multiple drug challenges to rule these antibiotic allergies out: Flagyl, nitrofurantoin, Bactrim (sulfa), and ciprofloxacin - These were your reactions that occurred nearly ten years ago and therefore you have the lowest chance of being allergic to them. - These visits will take 2 hours.   2. Concern about COVID19 vaccine - We will be getting  clearance to give them in [place] in the next 2-4 weeks (and likely [place] by that time as well). - We are cleared at to give it at [facility]. - Come at 9:30am for your appointment ([address] [place], [place] [address]).  3. Return in about 6 months (around 12/24/2020). This can be an in-person, a virtual Webex or a telephone follow up visit.   Subjective:   Jennifer Suarez is a 59 y.o. female presenting today for evaluation of  Chief Complaint  Patient presents with  . COVID Vaccine Question  . Allergic Reaction    multiple medication allergies.     Jennifer Suarez has a history of the following: Patient Active Problem List   Diagnosis Date Noted  . Family hx of colon cancer 08/28/2016  . C. difficile colitis 10/01/2011  . Lower abdominal pain 08/27/2011  . Diarrhea 08/27/2011  . Diverticulitis of colon 08/27/2011    History obtained from: chart review and patient.  Jennifer Suarez was referred by Selinda Flavin, MD.     Jennifer Suarez is a 59 y.o. female presenting for an evaluation of concern for vaccine reaction. She is concerned because her aunt passed away 6 days after the vaccination. Her aunt was 82 years ago. Her son got it without a problem. She heard of another one that had blood clots 6 days before and then he died afterwards.   She has had Miralax without a problem. She has had several of them  because of her diverticulitis.  However, during one of the episodes of bowel prep, she had one where she had episodes of vomiting. She is unsure which one she had the vomiting from. She is unsure what kind of bowel regimen she took for this episode. For her previous episodes of colonoscopies, she tolerated the bowel regimen just fine.  She gets a lot of antibiotics with her diverticulitis flares. She tends to get C diff. She has had similar reactions with these antibiotics that result in swelling on her face and itching. She reports hives over her entire body. She remembers  itching "lilke crazy". She has treated it with Benadryl.  She has a history of allergies to Flagyl, nitrofurantoin, sulfa antibiotics, ciprofloxacin, and Cefpodoxime.  Her reactions are difficult to tease out.  They occurred around 10 years ago.  She does report that she had some facial swelling when she was taking ciprofloxacin and Flagyl together during one of her hospitalizations for diverticulitis.  She has never required epinephrine for her reactions.  She has never undergone any kind of testing at all.  She has a history of diabetes and diverticulitis. She takes a karate class twice weekly. Her latest HgbA1c was 6.5%.   Otherwise, there is no history of other atopic diseases, including asthma, food allergies, environmental allergies, eczema, urticaria or contact dermatitis. There is no significant infectious history. Vaccinations are up to date.    Past Medical History: Patient Active Problem List   Diagnosis Date Noted  . Family hx of colon cancer 08/28/2016  . C. difficile colitis 10/01/2011  . Lower abdominal pain 08/27/2011  . Diarrhea 08/27/2011  . Diverticulitis of colon 08/27/2011    Medication List:  Allergies as of 06/23/2020      Reactions   Flagyl [metronidazole Hcl] Other (See Comments)   Facial swelling while on cipro and flagyl   Monistat [miconazole]    Nitrofurantoin Monohyd Macro Other (See Comments)   Patient not sure of allergic reaction.   Sulfa Antibiotics Other (See Comments)   Patient unsure of the reaction.   Vantin [cefpodoxime]    Ciprofloxacin Swelling   Facial swelling while on cipro and flagyl      Medication List       Accurate as of June 23, 2020 11:59 PM. If you have any questions, ask your nurse or doctor.        STOP taking these medications   Align 4 MG Caps Stopped by: Alfonse SpruceJoel Louis Tanis Burnley, MD   CALTRATE 600+D PO Stopped by: Alfonse SpruceJoel Louis Adriena Manfre, MD   cholecalciferol 1000 units tablet Commonly known as: VITAMIN D Stopped by:  Alfonse SpruceJoel Louis Dorthey Depace, MD   hyoscyamine 0.125 MG Tbdp disintergrating tablet Commonly known as: ANASPAZ Stopped by: Alfonse SpruceJoel Louis Tashi Band, MD   multivitamin with minerals tablet Stopped by: Alfonse SpruceJoel Louis Kaitlynne Wenz, MD   omeprazole 20 MG capsule Commonly known as: PRILOSEC Stopped by: Alfonse SpruceJoel Louis Shaniyah Wix, MD   promethazine 25 MG tablet Commonly known as: PHENERGAN Stopped by: Alfonse SpruceJoel Louis Aliana Kreischer, MD   ranitidine 150 MG capsule Commonly known as: ZANTAC Stopped by: Alfonse SpruceJoel Louis Braddock Servellon, MD     TAKE these medications   ALPRAZolam 0.25 MG tablet Commonly known as: XANAX Take 0.25 mg by mouth 2 (two) times daily as needed for anxiety. Anxiety   aspirin EC 81 MG tablet Take 81 mg by mouth daily.   atenolol 50 MG tablet Commonly known as: TENORMIN Take 50 mg by mouth daily.   atorvastatin 20 MG tablet Commonly  known as: LIPITOR Take 20 mg by mouth daily.   hydrochlorothiazide 25 MG tablet Commonly known as: HYDRODIURIL Take 25 mg by mouth daily.   lisinopril 5 MG tablet Commonly known as: ZESTRIL Take 5 mg by mouth daily.   metFORMIN 500 MG 24 hr tablet Commonly known as: GLUCOPHAGE-XR Take 500 mg by mouth daily with breakfast.   metoprolol tartrate 25 MG tablet Commonly known as: LOPRESSOR Take 25 mg by mouth every morning.       Birth History: non-contributory  Developmental History: non-contributory  Past Surgical History: Past Surgical History:  Procedure Laterality Date  . COLONOSCOPY  2007   Dr. Bing Plume to moderate sigmioid diverticulosis with no evidence for diverticulitis  . COLONOSCOPY  10/08/2011   OAC:ZYSA sided colonic diverticulosis  . exploratory laparoscopy     age 68  . NASAL SEPTUM SURGERY       Family History: Family History  Problem Relation Age of Onset  . Lung cancer Father   . Cancer Father   . Lung cancer Sister        ovarian?  . Cancer Sister        lung and ovarian  . Cancer Mother        metastatic primary  unknown  . Colon cancer Neg Hx      Social History: Mayte lives in a house that was built in 1982.  There is carpeting throughout the home.  She has electric heating and central cooling.  There are 3 dogs inside of the home.  She does have dust mite covers on her bed, but not her pillows.  She is exposed to tobacco in the home, but she herself does not smoke.  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  Review of Systems  Constitutional: Negative.  Negative for chills, fever, malaise/fatigue and weight loss.  HENT: Positive for sinus pain. Negative for congestion, ear discharge and ear pain.   Eyes: Negative for pain, discharge and redness.  Respiratory: Negative for cough, sputum production, shortness of breath and wheezing.   Cardiovascular: Negative.  Negative for chest pain and palpitations.  Gastrointestinal: Negative for abdominal pain, constipation, diarrhea, heartburn, nausea and vomiting.  Skin: Negative.  Negative for itching and rash.  Neurological: Negative for dizziness and headaches.  Endo/Heme/Allergies: Negative for environmental allergies. Does not bruise/bleed easily.       Objective:   Blood pressure 138/80, pulse 70, temperature 98.6 F (37 C), temperature source Temporal, resp. rate 18, weight 132 lb 12.8 oz (60.2 kg), SpO2 98 %. Body mass index is 26.82 kg/m.   Physical Exam:   Physical Exam Constitutional:      Appearance: She is well-developed.     Comments: Very pleasant female.  Cooperative with the exam.  HENT:     Head: Normocephalic and atraumatic.     Right Ear: Tympanic membrane, ear canal and external ear normal. No drainage, swelling or tenderness. Tympanic membrane is not injected, scarred, erythematous, retracted or bulging.     Left Ear: Tympanic membrane, ear canal and external ear normal. No drainage, swelling or tenderness. Tympanic membrane is not injected,  scarred, erythematous, retracted or bulging.     Nose: No nasal deformity, septal deviation, mucosal edema or rhinorrhea.     Right Turbinates: Not enlarged or swollen.     Left Turbinates: Not enlarged or swollen.     Right Sinus: No maxillary sinus tenderness  or frontal sinus tenderness.     Left Sinus: No maxillary sinus tenderness or frontal sinus tenderness.     Mouth/Throat:     Mouth: Mucous membranes are not pale and not dry.     Pharynx: Uvula midline.  Eyes:     General:        Right eye: No discharge.        Left eye: No discharge.     Conjunctiva/sclera: Conjunctivae normal.     Right eye: Right conjunctiva is not injected. No chemosis.    Left eye: Left conjunctiva is not injected. No chemosis.    Pupils: Pupils are equal, round, and reactive to light.  Cardiovascular:     Rate and Rhythm: Normal rate and regular rhythm.     Heart sounds: Normal heart sounds.  Pulmonary:     Effort: Pulmonary effort is normal. No tachypnea, accessory muscle usage, respiratory distress or retractions.     Breath sounds: Normal breath sounds. No wheezing, rhonchi or rales.  Chest:     Chest wall: No tenderness.  Abdominal:     Tenderness: There is no abdominal tenderness. There is no guarding or rebound.  Lymphadenopathy:     Head:     Right side of head: No submandibular, tonsillar or occipital adenopathy.     Left side of head: No submandibular, tonsillar or occipital adenopathy.     Cervical: No cervical adenopathy.  Skin:    Coloration: Skin is not pale.     Findings: No abrasion, erythema, petechiae or rash. Rash is not papular, urticarial or vesicular.  Neurological:     Mental Status: She is alert.      Diagnostic studies: none     Malachi Bonds, MD [facility]

## 2020-06-23 NOTE — Patient Instructions (Addendum)
1. Multiple antibiotic allergies - We will schedule you for multiple drug challenges to rule these antibiotic allergies out: Flagyl, nitrofurantoin, Bactrim (sulfa), and ciprofloxacin - These were your reactions that occurred nearly ten years ago and therefore you have the lowest chance of being allergic to them. - These visits will take 2 hours.   2. Concern about COVID19 vaccine - We will be getting clearance to give them in [place] in the next 2-4 weeks (and likely [place] by that time as well). - We are cleared at to give it at [facility]. - Come at 9:30am for your appointment ([address] [place], [place] [address]).  3. Return in about 6 months (around 12/24/2020). This can be an in-person, a virtual Webex or a telephone follow up visit.   Please inform us of any Emergency Department visits, hospitalizations, or changes in symptoms. Call us before going to the ED for breathing or allergy symptoms since we might be able to fit you in for a sick visit. Feel free to contact us anytime with any questions, problems, or concerns.  It was a pleasure to meet you today!  Websites that have reliable patient information: 1. American Academy of Asthma, Allergy, and Immunology: www.aaaai.org 2. Food Allergy Research and Education (FARE): foodallergy.org 3. Mothers of Asthmatics: http://www.asthmacommunitynetwork.org 4. American College of Allergy, Asthma, and Immunology: www.acaai.org   COVID-19 Vaccine Information can be found at: [email or web address].nl For questions related to vaccine distribution or appointments, please email [email or web address] or call [phone].     "Like" [place] on Facebook and Instagram for our latest updates!        Make sure you are registered to vote! If you have moved or changed any of your contact information, you will need to get this updated before voting!  In some cases, you MAY be able to register to  vote online: [email or web address]

## 2020-06-25 ENCOUNTER — Encounter: Payer: Self-pay | Admitting: Allergy & Immunology

## 2020-07-03 ENCOUNTER — Ambulatory Visit: Payer: BC Managed Care – PPO

## 2020-07-03 ENCOUNTER — Other Ambulatory Visit: Payer: Self-pay

## 2020-07-03 VITALS — BP 128/74 | HR 74 | Resp 18

## 2020-07-03 DIAGNOSIS — Z23 Encounter for immunization: Secondary | ICD-10-CM | POA: Diagnosis not present

## 2020-07-03 NOTE — Patient Instructions (Addendum)
1. Concern for vaccine adverse reaction - You tolerated the Pfizer vaccination. - Your vitals were all within normal limits, which is great.  - I think you should be fine to get the second vaccine.  - It was such a pleasure seeing you again!  - We are going to knock out these antibiotic allergies next!   2. Follow up as scheduled.     Please inform us of any Emergency Department visits, hospitalizations, or changes in symptoms. Call us before going to the ED for breathing or allergy symptoms since we might be able to fit you in for a sick visit. Feel free to contact us anytime with any questions, problems, or concerns.  It was a pleasure to see you again today!  Websites that have reliable patient information: 1. American Academy of Asthma, Allergy, and Immunology: www.aaaai.org 2. Food Allergy Research and Education (FARE): foodallergy.org 3. Mothers of Asthmatics: http://www.asthmacommunitynetwork.org 4. American College of Allergy, Asthma, and Immunology: www.acaai.org   COVID-19 Vaccine Information can be found at: [email or web address].[email or web address] For questions related to vaccine distribution or appointments, please email [email or web address] or call [phone].     "Like" [place] on Facebook and Instagram for our latest updates!        Make sure you are registered to vote! If you have moved or changed any of your contact information, you will need to get this updated before voting!  In some cases, you MAY be able to register to vote online: [email or web address]

## 2020-07-03 NOTE — Progress Notes (Signed)
   FOLLOW UP  Date of Service/Encounter:  07/03/20   Assessment:   Concern for vaccine adverse reaction  Multiple drug allergies  Vaccine counseling  Plan/Recommendations:   1. Concern for vaccine adverse reaction - You tolerated the Pfizer vaccination. - Your vitals were all within normal limits, which is great.  - I think you should be fine to get the second vaccine.  - It was such a pleasure seeing you again!  - We are going to knock out these antibiotic allergies next!   2. Follow up as scheduled.    Subjective:   LORALYE LOBERG is a 59 y.o. female presenting today for follow up of  Chief Complaint  Patient presents with  . Immunizations    KENZLEIGH SEDAM has a history of the following: Patient Active Problem List   Diagnosis Date Noted  . Family hx of colon cancer 08/28/2016  . C. difficile colitis 10/01/2011  . Lower abdominal pain 08/27/2011  . Diarrhea 08/27/2011  . Diverticulitis of colon 08/27/2011    History obtained from: chart review and patient.  Ameera is a 59 y.o. female presenting for a drug challenge. She was last seen in August 2021. At that time, we discussed her concerns with vaccine reactions. She did not have any risk factors necessitating testing, but she did request to get the vaccine under more observed circumstances. Therefore, we brought her in for a vaccine challenge.   Since the last visit, she has done well. She is off of all antihistamines. She is slightly anxious about the visit, but is otherwise doing very well.   Otherwise, there have been no changes to her past medical history, surgical history, family history, or social history.    Review of Systems  Constitutional: Negative.  Negative for fever, malaise/fatigue and weight loss.  HENT: Negative.  Negative for congestion, ear discharge and ear pain.   Eyes: Negative for pain, discharge and redness.  Respiratory: Negative for cough, sputum production, shortness of  breath and wheezing.   Cardiovascular: Negative.  Negative for chest pain and palpitations.  Gastrointestinal: Negative for abdominal pain and heartburn.  Skin: Negative.  Negative for itching and rash.  Neurological: Negative for dizziness and headaches.  Endo/Heme/Allergies: Negative for environmental allergies. Does not bruise/bleed easily.  Psychiatric/Behavioral: The patient is nervous/anxious.        Objective:   Blood pressure 128/74, pulse 74, resp. rate 18, SpO2 97 %. There is no height or weight on file to calculate BMI.    Physical Exam: deferred since this was a challenge appointment only  Rosetta received 0.1 mL of the [employer] vaccination. She was monitored for 30 minutes and then her vitals were normal. We then administered 0.2 mL of the Pfizer vaccine and she was monitored for another 60 minutes. Vitals were checked and were normal throughout.     Malachi Bonds, MD  [facility]

## 2020-07-04 ENCOUNTER — Telehealth: Payer: Self-pay | Admitting: Allergy & Immunology

## 2020-07-04 NOTE — Telephone Encounter (Signed)
PLEASE ADVISE on what the next steps are

## 2020-07-04 NOTE — Telephone Encounter (Signed)
Patient called and said that her son came in contact with some one at school with covid.Marland Kitchen and mom came in yesterday and got her covid shot with [place], and want to know about them been around each other. Need to talk with DR Dellis Anes or the nurse. [phone]

## 2020-07-04 NOTE — Telephone Encounter (Signed)
I think the protocol is unchanged at school.  If everybody was masked, I do not think there is anything else that needs to be done.  Even if we did swabbing now, it could be a false negative.  I would probably isolate as much as they can and then plan to test 5 days after his exposure.  Malachi Bonds, MD [facility]

## 2020-07-04 NOTE — Telephone Encounter (Signed)
Patient states her son was exposed yesterday and some days last week. Test came back positive today for her sons classmate. Patient wanted to know if she should be worried since she got the vaccine yesterday? She states she called her son's PCP office to get him tested and to also have him get the shot but the nurse told her that it would not be a good idea for him to get it if he's been exposed. Now she's concerned because she just got the shot and she's been around her son. I stated she should be fine since her son isn't experiencing any symptoms but I would send a message to be on the safe side.

## 2020-07-05 NOTE — Telephone Encounter (Signed)
I called Jennifer Suarez to confirm some details directly.  She reports that the last time her son was around the positive classmate was Monday.  He then went home sick on Monday, was tested that day, and results came back yesterday and were positive.  Oluwatobi son, however, has not shown any symptoms.  He is going tomorrow to the PCP to get tested.  This will be 4 days after his last exposure.  I talked more to mom about getting the vaccine following exposures.  There is nothing intrinsic about an exposure which makes receiving the vaccine dangerous in any way.  We do try to wait around 3 months after an actual COVID-19 infection to get the vaccine to avoid any attenuation of the immune response, but merely being exposed does not necessarily mean one cannot get vaccinated.  I recommended to mom that he go ahead and get the vaccine if he is negative tomorrow.  Mom is in agreement with this plan.  Evidently, they go to a school that is not requiring mask.  Mom has asked her son to mask at school, but he is not very good about doing it.  Therefore, he was in the classroom unmasked with the positive classmate.  The entire seventh grade is now in quarantine at home.  Malachi Bonds, MD [facility]

## 2020-07-05 NOTE — Progress Notes (Signed)
   Covid-19 Vaccination Clinic  Name:  Jennifer Suarez    MRN: [id number] DOB: [birth year]  07/05/2020  Jennifer Suarez was observed post Covid-19 immunization for 30 minutes based on pre-vaccination screening without incident. She was provided with Vaccine Information Sheet and instruction to access the V-Safe system.   Jennifer Suarez was instructed to call 911 with any severe reactions post vaccine: Marland Kitchen Difficulty breathing  . Swelling of face and throat  . A fast heartbeat  . A bad rash all over body  . Dizziness and weakness   Immunizations Administered    Name Date Dose VIS Date Route   Pfizer COVID-19 Vaccine 07/03/2020  9:40 AM 0.3 mL 12/29/2018 Intramuscular   Manufacturer: [employer], [employer]   Lot: [id number]   NDC: [address]-4

## 2020-07-24 ENCOUNTER — Ambulatory Visit (INDEPENDENT_AMBULATORY_CARE_PROVIDER_SITE_OTHER): Payer: BC Managed Care – PPO

## 2020-07-24 ENCOUNTER — Other Ambulatory Visit: Payer: Self-pay

## 2020-07-24 DIAGNOSIS — Z23 Encounter for immunization: Secondary | ICD-10-CM | POA: Diagnosis not present

## 2020-07-24 NOTE — Progress Notes (Signed)
   Covid-19 Vaccination Clinic  Name:  Jennifer Suarez    MRN: [id number] DOB: [birth year]  07/24/2020  [name] was observed post Covid-19 immunization for 15 minutes without incident. She was provided with Vaccine Information Sheet and instruction to access the V-Safe system.   [name] was instructed to call 911 with any severe reactions post vaccine: Marland Kitchen Difficulty breathing  . Swelling of face and throat  . A fast heartbeat  . A bad rash all over body  . Dizziness and weakness   Immunizations Administered    Name Date Dose VIS Date Route   Pfizer COVID-19 Vaccine 07/24/2020  9:27 AM 0.3 mL 12/29/2018 Intramuscular   Manufacturer: [employer], [employer]   Lot: [id number]   NDC: [address]-4

## 2020-07-25 ENCOUNTER — Telehealth: Payer: Self-pay | Admitting: Allergy & Immunology

## 2020-07-25 NOTE — Telephone Encounter (Signed)
Patient received COVID vaccine yesterday in [facility]. Last night patient had diarrhea all night, chills, body aches, did not sleep good and woke up this morning with a headache,fever, sore arm, nauseous and nervous .   Patient knows it is normal to have flu like symptoms after getting the shot, but would like to know what she can take for this symptoms.   Please advise.

## 2020-07-25 NOTE — Telephone Encounter (Signed)
Dr. Gallagher please advise.  

## 2020-07-26 NOTE — Telephone Encounter (Signed)
She can take any over the counter medication including antihistamines and ibuprofen. I would avoid taking any systemic steroids like prednisone. It should pass within 2-3 days.  Malachi Bonds, MD [facility]

## 2020-07-26 NOTE — Telephone Encounter (Signed)
Patient states she is improving from her symptoms and is doing much better. She had to call her PCP and they advised patient what she could take and do to easy her discomfort.

## 2020-07-27 NOTE — Telephone Encounter (Signed)
Awesome. Good to hear.  Malachi Bonds, MD [facility]

## 2020-08-16 ENCOUNTER — Other Ambulatory Visit: Payer: Self-pay

## 2020-08-16 MED ORDER — METRONIDAZOLE 250 MG PO TABS: 250.0000 mg | ORAL_TABLET | Freq: Every day | ORAL | 0 refills | Status: DC

## 2020-08-18 ENCOUNTER — Encounter: Payer: Self-pay | Admitting: Allergy & Immunology

## 2020-08-18 ENCOUNTER — Other Ambulatory Visit: Payer: Self-pay

## 2020-08-18 ENCOUNTER — Ambulatory Visit (INDEPENDENT_AMBULATORY_CARE_PROVIDER_SITE_OTHER): Payer: BC Managed Care – PPO | Admitting: Allergy & Immunology

## 2020-08-18 VITALS — BP 128/72 | HR 70 | Temp 98.0°F | Resp 16

## 2020-08-18 DIAGNOSIS — Z889 Allergy status to unspecified drugs, medicaments and biological substances status: Secondary | ICD-10-CM

## 2020-08-18 DIAGNOSIS — T378X5D Adverse effect of other specified systemic anti-infectives and antiparasitics, subsequent encounter: Secondary | ICD-10-CM | POA: Diagnosis not present

## 2020-08-18 NOTE — Progress Notes (Signed)
FOLLOW UP  Date of Service/Encounter:  08/18/20   Assessment:   Multiple drug allergies - passed Flagyl challenge  Plan/Recommendations:   1. Drug allergy  - You passed your Flagyl challenge. - Call us with any issues over the weekend.  - My work cell is [phone]. - We will see you at your next challenge appointment.  2. Follow up as scheduled.   Subjective:   Jennifer Suarez is a 59 y.o. female presenting today for follow up of  Chief Complaint  Patient presents with  . Food/Drug Challenge    Flagly    ABRIANNA SIDMAN has a history of the following: Patient Active Problem List   Diagnosis Date Noted  . Family hx of colon cancer 08/28/2016  . C. difficile colitis 10/01/2011  . Lower abdominal pain 08/27/2011  . Diarrhea 08/27/2011  . Diverticulitis of colon 08/27/2011    History obtained from: chart review and patient.  Presli is a 59 y.o. female presenting for a drug challenge. We did send in Flagyl for the challenge.    She did have 36 hours of muscle weakness and whatnot after the COVID19 vaccine During the night, she was febrile and nauseous. She was fine after that. Her son was diagnosed with bronchitis and pneumonia, although COVID was negative.  Both her son and her husband were sick when they were going to come in and get the vaccine, but after they saw how sick she was after the second vaccine, they both decided not to get the vaccine.   Her son is coming around and might end up getting them anyway  Otherwise, there have been no changes to her past medical history, surgical history, family history, or social history.    Review of Systems  Constitutional: Negative.  Negative for fever, malaise/fatigue and weight loss.  HENT: Negative.  Negative for congestion, ear discharge, ear pain, sinus pain and sore throat.   Eyes: Negative for pain, discharge and redness.  Respiratory: Negative for cough, sputum production, shortness of breath and  wheezing.   Cardiovascular: Negative.  Negative for chest pain and palpitations.  Gastrointestinal: Negative for abdominal pain, constipation, diarrhea, heartburn, nausea and vomiting.  Skin: Negative.  Negative for itching and rash.  Neurological: Negative for dizziness and headaches.  Endo/Heme/Allergies: Negative for environmental allergies. Does not bruise/bleed easily.       Objective:   Blood pressure 128/72, pulse 70, temperature 98 F (36.7 C), temperature source Temporal, resp. rate 16, SpO2 95 %. There is no height or weight on file to calculate BMI.   Physical Exam: deferred since this was a challenge appointment only   Allergy Studies:     Oral Challenge - 08/18/20 1100    Challenge Food/Drug Flagyl    Food/Drug provided by patient    Time 0931    Dose 125 mg    BP 130/76    Pulse 60    Respirations 16    Lungs 98%    Time 1006    Dose 375 mg    BP 138/76    Pulse 62    Respirations 16    Lungs 99%    Skin clear    Mouth clear    Comments okay to discharge           Allergy testing results were read and interpreted by myself, documented by clinical staff.      Malachi Bonds, MD  [facility]

## 2020-08-18 NOTE — Patient Instructions (Addendum)
1. Drug allergy  - You passed your Flagyl challenge. - Call us with any issues over the weekend.  - My work cell is [phone]. - We will see you at your next challenge appointment.  2. Follow up as scheduled.    Please inform us of any Emergency Department visits, hospitalizations, or changes in symptoms. Call us before going to the ED for breathing or allergy symptoms since we might be able to fit you in for a sick visit. Feel free to contact us anytime with any questions, problems, or concerns.  It was a pleasure to see you again today!  Websites that have reliable patient information: 1. American Academy of Asthma, Allergy, and Immunology: www.aaaai.org 2. Food Allergy Research and Education (FARE): foodallergy.org 3. Mothers of Asthmatics: http://www.asthmacommunitynetwork.org 4. American College of Allergy, Asthma, and Immunology: www.acaai.org   COVID-19 Vaccine Information can be found at: PodExchange.nl For questions related to vaccine distribution or appointments, please email [email or web address] or call [phone].     "Like" [place] on Facebook and Instagram for our latest updates!     HAPPY FALL!     Make sure you are registered to vote! If you have moved or changed any of your contact information, you will need to get this updated before voting!  In some cases, you MAY be able to register to vote online: [email or web address]

## 2020-08-23 ENCOUNTER — Telehealth: Payer: Self-pay | Admitting: Allergy & Immunology

## 2020-08-23 NOTE — Telephone Encounter (Signed)
Patient has a challenge on Friday, 08/25/20, for Cipro. She called today to make sure the medication will be called in to her pharmacy, [facility].

## 2020-08-23 NOTE — Telephone Encounter (Signed)
Dr. Gallagher please advise.  

## 2020-08-24 MED ORDER — CIPROFLOXACIN HCL 250 MG PO TABS: 500.0000 mg | ORAL_TABLET | Freq: Once | ORAL | 0 refills | Status: AC

## 2020-08-24 NOTE — Telephone Encounter (Signed)
Patient has been made aware.

## 2020-08-24 NOTE — Telephone Encounter (Signed)
Sent in script.  ° °Jennifer Chandran, MD °[facility] ° °

## 2020-08-25 ENCOUNTER — Encounter: Payer: Self-pay | Admitting: Allergy & Immunology

## 2020-08-25 ENCOUNTER — Ambulatory Visit (INDEPENDENT_AMBULATORY_CARE_PROVIDER_SITE_OTHER): Payer: BC Managed Care – PPO | Admitting: Allergy & Immunology

## 2020-08-25 ENCOUNTER — Other Ambulatory Visit: Payer: Self-pay

## 2020-08-25 VITALS — BP 122/64 | HR 89 | Resp 17

## 2020-08-25 DIAGNOSIS — T368X5D Adverse effect of other systemic antibiotics, subsequent encounter: Secondary | ICD-10-CM | POA: Diagnosis not present

## 2020-08-25 DIAGNOSIS — Z889 Allergy status to unspecified drugs, medicaments and biological substances status: Secondary | ICD-10-CM

## 2020-08-25 MED ORDER — SULFAMETHOXAZOLE-TRIMETHOPRIM 400-80 MG PO TABS: 2.0000 | ORAL_TABLET | Freq: Once | ORAL | 0 refills | Status: DC

## 2020-08-25 MED ORDER — NITROFURANTOIN MACROCRYSTAL 25 MG PO CAPS: 100.0000 mg | ORAL_CAPSULE | Freq: Once | ORAL | 0 refills | Status: DC

## 2020-08-25 NOTE — Progress Notes (Signed)
° °  FOLLOW UP  Date of Service/Encounter:  08/25/20   Assessment:   Multiple drug allergies - passed ciprofloxacin challenge  Plan/Recommendations:   1. Drug allergy  - You passed your ciprofloxacin challenge. - Call us with any issues over the weekend.  - My work cell is [phone]. - We will see you at your next challenge appointment.  2. Follow up as scheduled for the macrodantin challenge.   Subjective:   Jennifer Suarez is a 59 y.o. female presenting today for follow up of No chief complaint on file.   Jennifer Suarez has a history of the following: Patient Active Problem List   Diagnosis Date Noted   Family hx of colon cancer 08/28/2016   C. difficile colitis 10/01/2011   Lower abdominal pain 08/27/2011   Diarrhea 08/27/2011   Diverticulitis of colon 08/27/2011    History obtained from: chart review and patient.  Jennifer Suarez is a 59 y.o. female presenting for a drug challenge.  She was last seen in October 2021 at which time she passed a Flagyl challenge.  She had no issues during the challenge, but reports today that she developed some facial fullness and tightness that evening.  This resolved with next morning without any lasting effects.  She did not treated with anything or even call us.  Since last visit, she has done well.  Otherwise, there have been no changes to her past medical history, surgical history, family history, or social history.    Review of Systems  Constitutional: Negative.  Negative for chills, fever, malaise/fatigue and weight loss.  HENT: Negative for congestion, ear discharge, ear pain and sinus pain.   Eyes: Negative for pain, discharge and redness.  Respiratory: Negative for cough, sputum production, shortness of breath and wheezing.   Cardiovascular: Negative.  Negative for chest pain and palpitations.  Gastrointestinal: Negative for abdominal pain, constipation, diarrhea, heartburn, nausea and vomiting.  Skin: Negative.   Negative for itching and rash.  Neurological: Negative for dizziness and headaches.  Endo/Heme/Allergies: Negative for environmental allergies. Does not bruise/bleed easily.       Objective:   Blood pressure 122/64, pulse 89, resp. rate 17, SpO2 96 %. There is no height or weight on file to calculate BMI.   Physical Exam: deferred since this was a drug challenge appointment only.   Open graded ciprofloxacin oral challenge: The patient was able to tolerate the challenge today without adverse signs or symptoms. Vital signs were stable throughout the challenge and observation period. She received multiple doses separated by 30 minutes, each of which was separated by vitals and a brief physical exam. She received the following doses: 125mg  and 375mg . She was monitored for 60 minutes following the last dose.      Oral Challenge - 08/25/20 0900    Challenge Food/Drug Cipro    Food/Drug provided by Patient    BP 128/70    Pulse 76    Respirations 16    Time 0913    Dose 125 mg    BP 122/64    Pulse 89    Respirations 17    Time 0950    Dose 375mg     BP 124/70    Pulse 75    Respirations 17                , MD  [facility]

## 2020-08-25 NOTE — Patient Instructions (Addendum)
1. Drug allergy  - You passed your ciprofloxacin challenge. - Call us with any issues over the weekend.  - My work cell is [phone]. - We will see you at your next challenge appointment.  2. Follow up as scheduled for the macrodantin challenge.   Please inform us of any Emergency Department visits, hospitalizations, or changes in symptoms. Call us before going to the ED for breathing or allergy symptoms since we might be able to fit you in for a sick visit. Feel free to contact us anytime with any questions, problems, or concerns.  It was a pleasure to see you again today!  Websites that have reliable patient information: 1. American Academy of Asthma, Allergy, and Immunology: www.aaaai.org 2. Food Allergy Research and Education (FARE): foodallergy.org 3. Mothers of Asthmatics: http://www.asthmacommunitynetwork.org 4. American College of Allergy, Asthma, and Immunology: www.acaai.org   COVID-19 Vaccine Information can be found at: [email or web address].nl For questions related to vaccine distribution or appointments, please email [email or web address] or call [phone].     "Like" [place] on Facebook and Instagram for our latest updates!     HAPPY FALL!     Make sure you are registered to vote! If you have moved or changed any of your contact information, you will need to get this updated before voting!  In some cases, you MAY be able to register to vote online: [email or web address]

## 2020-08-31 MED ORDER — NITROFURANTOIN MACROCRYSTAL 25 MG PO CAPS: 100.0000 mg | ORAL_CAPSULE | Freq: Once | ORAL | 0 refills | Status: AC

## 2020-08-31 MED ORDER — SULFAMETHOXAZOLE-TRIMETHOPRIM 400-80 MG PO TABS: 2.0000 | ORAL_TABLET | Freq: Once | ORAL | 0 refills | Status: AC

## 2020-08-31 NOTE — Addendum Note (Signed)
Addended by: Alfonse Spruce on: 08/31/2020 09:35 AM   Modules accepted: Orders

## 2020-08-31 NOTE — Progress Notes (Signed)
Reordered medications for her upcoming drug challenges.  Malachi Bonds, MD [facility]

## 2020-09-01 ENCOUNTER — Other Ambulatory Visit: Payer: Self-pay

## 2020-09-01 ENCOUNTER — Ambulatory Visit (INDEPENDENT_AMBULATORY_CARE_PROVIDER_SITE_OTHER): Payer: BC Managed Care – PPO | Admitting: Allergy & Immunology

## 2020-09-01 ENCOUNTER — Encounter: Payer: Self-pay | Admitting: Allergy & Immunology

## 2020-09-01 VITALS — BP 134/68 | HR 84 | Resp 18

## 2020-09-01 DIAGNOSIS — T378X5D Adverse effect of other specified systemic anti-infectives and antiparasitics, subsequent encounter: Secondary | ICD-10-CM

## 2020-09-01 DIAGNOSIS — T378X5A Adverse effect of other specified systemic anti-infectives and antiparasitics, initial encounter: Secondary | ICD-10-CM | POA: Insufficient documentation

## 2020-09-01 NOTE — Progress Notes (Signed)
FOLLOW UP  Date of Service/Encounter:  09/01/20   Assessment:   Multiple drug allergies -passed nitrofurantoin challenge  Plan/Recommendations:   1. Drug allergy  - You passed your nitrofurantoin challenge. - Call us with any issues over the weekend.  - My work cell is [phone]. - We will see you at your next challenge appointment.  2. Follow up as scheduled for the Bactrim challenge.  Subjective:   Jennifer Suarez is a 59 y.o. female presenting today for follow up of  Chief Complaint  Patient presents with   Food/Drug Challenge    Nitrofrantoin MCR 100 MG    OZA OBERLE has a history of the following: Patient Active Problem List   Diagnosis Date Noted   Nitrofurantoin adverse reaction 09/01/2020   Family hx of colon cancer 08/28/2016   C. difficile colitis 10/01/2011   Lower abdominal pain 08/27/2011   Diarrhea 08/27/2011   Diverticulitis of colon 08/27/2011    History obtained from: chart review and patient.  Jennifer Suarez is a 59 y.o. female presenting for a drug challenge.  She presents today for Macrodantin challenge.  She is doing very well and has been off of her antihistamines.  Since the last visit, she has done well. She brought along a Macrodantin capsule.  I tried to order tablets, but apparently they do not exist.  Otherwise, there have been no changes to her past medical history, surgical history, family history, or social history.    Review of Systems  Constitutional: Negative.  Negative for chills, fever, malaise/fatigue and weight loss.  HENT: Negative for congestion, ear discharge, ear pain and sinus pain.   Eyes: Negative for pain, discharge and redness.  Respiratory: Negative for cough, sputum production, shortness of breath and wheezing.   Cardiovascular: Negative.  Negative for chest pain and palpitations.  Gastrointestinal: Negative for abdominal pain, constipation, diarrhea, heartburn, nausea and vomiting.  Skin:  Negative.  Negative for itching and rash.  Neurological: Negative for dizziness and headaches.  Endo/Heme/Allergies: Negative for environmental allergies. Does not bruise/bleed easily.       Objective:   Blood pressure 134/68, pulse 84, resp. rate 18, SpO2 97 %. There is no height or weight on file to calculate BMI.   Physical Exam: deferred since this was a challenge appointment only  Open graded nitrofurantoin oral challenge: The patient was able to tolerate the challenge today without adverse signs or symptoms. Vital signs were stable throughout the challenge and observation period. She received multiple doses separated by 30 minutes, each of which was separated by vitals and a brief physical exam. She received the following doses: 50mg  and then 150 mg. She was monitored for 60 minutes following the last dose.   She did well with the challenge. See flow sheets for vitals.    Allergy Studies:     Oral Challenge - 09/01/20 0900    Challenge Food/Drug Macradantin    Food/Drug provided by Patient    BP 134/68    Pulse 84    Respirations 18    Time 0925    Dose 25% of 100mg  Cap    BP 134/68    Pulse 84    Respirations 18    Time 0955    Dose 75% of 100mg  Cap    BP 134/78    Pulse 70    Respirations 16              09/03/20, MD  [facility]

## 2020-09-01 NOTE — Patient Instructions (Addendum)
1. Drug allergy  - You passed your nitrofurantoin challenge. - Call us with any issues over the weekend.  - My work cell is [phone]. - We will see you at your next challenge appointment.  2. Follow up as scheduled for the Bactrim challenge.   Please inform us of any Emergency Department visits, hospitalizations, or changes in symptoms. Call us before going to the ED for breathing or allergy symptoms since we might be able to fit you in for a sick visit. Feel free to contact us anytime with any questions, problems, or concerns.  It was a pleasure to see you again today!  Websites that have reliable patient information: 1. American Academy of Asthma, Allergy, and Immunology: www.aaaai.org 2. Food Allergy Research and Education (FARE): foodallergy.org 3. Mothers of Asthmatics: http://www.asthmacommunitynetwork.org 4. American College of Allergy, Asthma, and Immunology: www.acaai.org   COVID-19 Vaccine Information can be found at: PodExchange.nl For questions related to vaccine distribution or appointments, please email [email or web address] or call [phone].     "Like" [place] on Facebook and Instagram for our latest updates!     HAPPY FALL!     Make sure you are registered to vote! If you have moved or changed any of your contact information, you will need to get this updated before voting!  In some cases, you MAY be able to register to vote online: [email or web address]

## 2020-09-22 ENCOUNTER — Other Ambulatory Visit: Payer: Self-pay

## 2020-09-22 ENCOUNTER — Encounter: Payer: Self-pay | Admitting: Allergy & Immunology

## 2020-09-22 ENCOUNTER — Ambulatory Visit (INDEPENDENT_AMBULATORY_CARE_PROVIDER_SITE_OTHER): Payer: BC Managed Care – PPO | Admitting: Allergy & Immunology

## 2020-09-22 VITALS — BP 132/74 | HR 70 | Resp 17

## 2020-09-22 DIAGNOSIS — Z882 Allergy status to sulfonamides status: Secondary | ICD-10-CM | POA: Diagnosis not present

## 2020-09-22 DIAGNOSIS — Z889 Allergy status to unspecified drugs, medicaments and biological substances status: Secondary | ICD-10-CM

## 2020-09-22 NOTE — Patient Instructions (Signed)
1. Drug allergy  - You passed your sulfa challenge. - Call us with any issues over the weekend.  - My work cell is [phone]. - We will see you at your next challenge appointment.  2. Follow up as needed. TEXT OR CALL ME WITH PROBLEMS!    Please inform us of any Emergency Department visits, hospitalizations, or changes in symptoms. Call us before going to the ED for breathing or allergy symptoms since we might be able to fit you in for a sick visit. Feel free to contact us anytime with any questions, problems, or concerns.  It was a pleasure to see you again today!  Websites that have reliable patient information: 1. American Academy of Asthma, Allergy, and Immunology: www.aaaai.org 2. Food Allergy Research and Education (FARE): foodallergy.org 3. Mothers of Asthmatics: http://www.asthmacommunitynetwork.org 4. American College of Allergy, Asthma, and Immunology: www.acaai.org   COVID-19 Vaccine Information can be found at: [email or web address].nl For questions related to vaccine distribution or appointments, please email [email or web address] or call [phone].     "Like" [place] on Facebook and Instagram for our latest updates!     HAPPY FALL!     Make sure you are registered to vote! If you have moved or changed any of your contact information, you will need to get this updated before voting!  In some cases, you MAY be able to register to vote online: [email or web address]

## 2020-09-22 NOTE — Progress Notes (Signed)
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 Drug allergy  - You passed your sulfa challenge. - Call us with any issues over the weekend.  - My work cell is [phone]. - We will see you at your next challenge appointment.  2. Follow up as needed. TEXT OR CALL ME WITH PROBLEMS!    Subjective:   Jennifer Suarez is a 59 y.o. female presenting today for follow up of  Chief Complaint  Patient presents with  . Food/Drug Challenge    Jennifer Suarez has a history of the following: Patient Active Problem List   Diagnosis Date Noted  . Nitrofurantoin adverse reaction 09/01/2020  . Family hx of colon cancer 08/28/2016  . C. difficile colitis 10/01/2011  . Lower abdominal pain 08/27/2011  . Diarrhea 08/27/2011  . Diverticulitis of colon 08/27/2011    History obtained from: chart review and patient.  Jennifer Suarez is a 59 y.o. female presenting for a drug challenge.  She came to me initially due to concern of receiving the COVID-19 vaccine.  She did safely receive it in a graded fashion.  She also had multiple antibiotic allergies, as well as slowly take these off using drug challenge appointments.  Today, we are addressing Bactrim.  She is doing well with the challenges.  She did have some itching around her ears after the ciprofloxacin, but this was very delayed.  We had a conversation about this and she is fine with taking these off her allergy list, but she will does let anyone know that she might need to be treated for the side effects, such as itching.  She realizes that this is not a life-threatening IgE mediated reaction.  She had no oral involvement or skin sloughing.  She continues to work on commencing her husband and son to get the COVID-19 vaccines.  She wants them both to be on the same page before she brings them in for  vaccination.  Otherwise, there have been no changes to her past medical history, surgical history, family history, or social history.    Review of Systems  Constitutional: Negative.  Negative for fever, malaise/fatigue and weight loss.  HENT: Negative.  Negative for congestion, ear discharge and ear pain.   Eyes: Negative for pain, discharge and redness.  Respiratory: Negative for cough, sputum production, shortness of breath and wheezing.   Cardiovascular: Negative.  Negative for chest pain and palpitations.  Gastrointestinal: Negative for abdominal pain and heartburn.  Skin: Negative.  Negative for itching and rash.  Neurological: Negative for dizziness and headaches.  Endo/Heme/Allergies: Negative for environmental allergies. Does not bruise/bleed easily.       Objective:   Blood pressure 132/74, pulse 70, resp. rate 17, SpO2 98 %. There is no height or weight on file to calculate BMI.   Physical Exam: deferred since this was a drug challenge appointment only     Patient was given half of a tablet monitored for 30 minutes.  She did have vitals which were normal and then we gave 1-1/2 tablets for full dose.  She was monitored for 1 hour after that.   Oral Challenge - 09/22/20 0900    Challenge Food/Drug Sulfa     Food/Drug provided by Patient    BP 132/74    Pulse 70    Respirations 17    Time 0936    Dose  1/2 tablet    Time 1007    Dose 1 1/2 tablets    BP 130/76    Pulse 66    Respirations 17               Jennifer Bonds, MD  [facility]

## 2020-09-23 ENCOUNTER — Encounter: Payer: Self-pay | Admitting: Allergy & Immunology

## 2020-09-25 NOTE — Progress Notes (Signed)
I called patient and asked about another drug challenge. Patient says she thinks she did the drug challenge already and she says that she's come to the conclusion that she has a slight allergy to Cipro.  I didn't see a challenge for Cefpodoxime in her chart. Patient wanted to be sure that this was the right medication. Okease advise.

## 2020-09-26 ENCOUNTER — Telehealth: Payer: Self-pay

## 2020-09-26 NOTE — Telephone Encounter (Signed)
Patient is wanting to rule out Cefpodoxime. Patient is scheduled to come in on 11/17/2020

## 2020-09-26 NOTE — Telephone Encounter (Signed)
-----   Message from Alfonse Spruce, MD sent at 09/23/2020  7:08 AM EST ----- I am just finishing her chart.  I noticed that we still had a Cefpodoxime allergy listed.  Can you call patient to see if she wants to do a challenge to rule this when out as well?

## 2020-10-02 NOTE — Telephone Encounter (Signed)
Great - thanks!   Britni Driscoll, MD [facility]    

## 2020-11-17 ENCOUNTER — Encounter: Payer: Self-pay | Admitting: Allergy & Immunology

## 2021-06-08 ENCOUNTER — Other Ambulatory Visit (HOSPITAL_COMMUNITY): Payer: Self-pay | Admitting: Family Medicine

## 2021-06-08 ENCOUNTER — Other Ambulatory Visit (HOSPITAL_COMMUNITY): Payer: Self-pay | Admitting: Internal Medicine

## 2021-06-08 ENCOUNTER — Other Ambulatory Visit: Payer: Self-pay | Admitting: Internal Medicine

## 2021-06-08 DIAGNOSIS — M810 Age-related osteoporosis without current pathological fracture: Secondary | ICD-10-CM

## 2021-06-08 DIAGNOSIS — Z1231 Encounter for screening mammogram for malignant neoplasm of breast: Secondary | ICD-10-CM

## 2021-06-22 ENCOUNTER — Ambulatory Visit (HOSPITAL_COMMUNITY): Payer: 59

## 2021-07-04 ENCOUNTER — Ambulatory Visit (HOSPITAL_COMMUNITY)
Admission: RE | Admit: 2021-07-04 | Discharge: 2021-07-04 | Disposition: A | Discharge status: Home / Self Care | Payer: 59 | Source: Ambulatory Visit | Attending: Family Medicine | Admitting: Family Medicine

## 2021-07-04 ENCOUNTER — Other Ambulatory Visit: Payer: Self-pay

## 2021-07-04 ENCOUNTER — Ambulatory Visit (HOSPITAL_COMMUNITY)
Admission: RE | Admit: 2021-07-04 | Discharge: 2021-07-04 | Disposition: A | Discharge status: Home / Self Care | Payer: 59 | Source: Ambulatory Visit | Attending: Internal Medicine | Admitting: Internal Medicine

## 2021-07-04 DIAGNOSIS — M810 Age-related osteoporosis without current pathological fracture: Secondary | ICD-10-CM

## 2021-07-04 DIAGNOSIS — Z1231 Encounter for screening mammogram for malignant neoplasm of breast: Secondary | ICD-10-CM | POA: Insufficient documentation

## 2022-02-02 DIAGNOSIS — M5410 Radiculopathy, site unspecified: Secondary | ICD-10-CM

## 2022-02-02 HISTORY — DX: Radiculopathy, site unspecified: M54.10

## 2022-03-25 ENCOUNTER — Other Ambulatory Visit (HOSPITAL_COMMUNITY)
Admission: RE | Admit: 2022-03-25 | Discharge: 2022-03-25 | Disposition: A | Discharge status: Home / Self Care | Payer: 59 | Source: Ambulatory Visit | Attending: Obstetrics & Gynecology | Admitting: Obstetrics & Gynecology

## 2022-03-25 ENCOUNTER — Ambulatory Visit: Payer: 59 | Admitting: Obstetrics & Gynecology

## 2022-03-25 ENCOUNTER — Encounter: Payer: Self-pay | Admitting: Obstetrics & Gynecology

## 2022-03-25 VITALS — BP 146/78 | HR 71 | Ht 59.0 in | Wt 127.6 lb

## 2022-03-25 DIAGNOSIS — N814 Uterovaginal prolapse, unspecified: Secondary | ICD-10-CM

## 2022-03-25 DIAGNOSIS — Z124 Encounter for screening for malignant neoplasm of cervix: Secondary | ICD-10-CM

## 2022-03-25 DIAGNOSIS — Z30432 Encounter for removal of intrauterine contraceptive device: Secondary | ICD-10-CM | POA: Diagnosis not present

## 2022-03-25 NOTE — Progress Notes (Signed)
GYN VISIT Patient name: Jennifer Suarez MRN [id number]  Date of birth: 1960/12/04 Chief Complaint:   New Patient (Initial Visit) and Vaginal Prolapse  History of Present Illness:   Jennifer Suarez is a 61 y.o. G42P1011 PM female being seen today for prolapse concerns:  -Prolapse: Started about 2 mos ago- noticed that something is hanging down.  Feels like a "tampon" is coming out.  Denies bleeding or discharge. Recently has increased her straining/lifting.  Pt was seen at [facility] May 1 who told her it was uterine prolapse.  Not sexually active.  No other acute complaints  Denies urinary urgency, frequency.  Denies dysuria.  Denies incontinence.  Denies nocturia.  Of note, at her last pelvic US they told her that after she voided, she still had a full bladder, she denies feeling incomplete voiding.  Records reviewed from 2018- large endometrial polyp, Kyleena placed at that time to prevent polyp recurrent. Final path was benign.  Per records, they had advised follow up US, which she had not completed. She denies any vaginal bleeding.  No LMP recorded. Patient is postmenopausal.     03/25/2022    9:06 AM  Depression screen PHQ 2/9  Decreased Interest 0  Down, Depressed, Hopeless 0  PHQ - 2 Score 0  Altered sleeping 0  Tired, decreased energy 0  Change in appetite 0  Feeling bad or failure about yourself  0  Trouble concentrating 0  Moving slowly or fidgety/restless 0  Suicidal thoughts 0  PHQ-9 Score 0     Review of Systems:   Pertinent items are noted in HPI Denies fever/chills, dizziness, headaches, visual disturbances, fatigue, shortness of breath, chest pain, abdominal pain, vomiting, Denies issues with bowel movements, urination, or intercourse unless otherwise stated above.  Pertinent History Reviewed:  Reviewed past medical,surgical, social, obstetrical and family history.  Reviewed problem list, medications and allergies. Physical Assessment:   Vitals:    03/25/22 0909  BP: (!) 146/78  Pulse: 71  Weight: 127 lb 9.6 oz (57.9 kg)  Height: 4\' 11"  (1.499 m)  Body mass index is 25.77 kg/m.       Physical Examination:   General appearance: alert, well appearing, and in no distress  Psych: mood appropriate, normal affect  Skin: warm & dry   Cardiovascular: normal heart rate noted  Respiratory: normal respiratory effort, no distress  Abdomen: soft, non-tender   Pelvic: VULVA: normal appearing vulva with no masses, tenderness or lesions, VAGINA: atrophic, CERVIX: normal appearing cervix without discharge or lesions, strings visualized at os  UTERUS: uterus is normal size, shape, consistency and nontender.  With valsalva- Stage 2 uterine prolapse appreciated  Extremities: no edema, no calf tenderness bilaterally  Chaperone: Angel Neas    IUD REMOVAL  A Peterson speculum was placed in the vagina.  The cervix was visualized, and the strings visible. They were grasped and the [facility]  IUD was easily removed intact without complications. The patient tolerated the procedure well.   Pessary Fitting -reviewed pros/cons of pessary -Ring #2 placed- pt ambulated and voided without difficulty -felt comfortable with this device and pt given ring #2   Assessment & Plan:  1) Uterine prolapse -reviewed findings on exam -discussed conservative management including pessary or monitoring symptoms vs surgical intervention -Reviewed risk/benefit and common concerns -Pessary fitting completed today, fitted for Milex ring #2  2) IUD removal -device to expire this year -device removed without difficulty -some spotting to be expected following removal -once bleeding resolved, may insert  ring -in terms of replacement this may be something to consider pending results of ultrasound and/or if postmenopausal bleeding returns  3) Preventive screening -mammogram due in Sept -pap collected today, reviewed screening guidelines   Return in about 6 weeks (around  05/06/2022) for pelvic US (h/o endometrial polyp)- in our office, pessary follow up (Mcarthur Ivins or [facility]ure).   [name], DO Attending Obstetrician & Gynecologist, [facility] for [employer], [facility]

## 2022-03-26 LAB — CYTOLOGY - PAP
Comment: NEGATIVE
Diagnosis: NEGATIVE
High risk HPV: NEGATIVE

## 2022-03-27 ENCOUNTER — Encounter: Payer: Self-pay | Admitting: Obstetrics & Gynecology

## 2022-04-01 ENCOUNTER — Encounter: Payer: Self-pay | Admitting: Obstetrics & Gynecology

## 2022-05-06 ENCOUNTER — Ambulatory Visit: Payer: 59 | Admitting: Obstetrics & Gynecology

## 2022-05-06 ENCOUNTER — Other Ambulatory Visit: Payer: 59

## 2022-05-29 ENCOUNTER — Other Ambulatory Visit: Payer: Self-pay | Admitting: Obstetrics & Gynecology

## 2022-05-29 DIAGNOSIS — N840 Polyp of corpus uteri: Secondary | ICD-10-CM

## 2022-05-30 ENCOUNTER — Ambulatory Visit (INDEPENDENT_AMBULATORY_CARE_PROVIDER_SITE_OTHER): Payer: 59

## 2022-05-30 ENCOUNTER — Other Ambulatory Visit: Payer: 59

## 2022-05-30 ENCOUNTER — Ambulatory Visit: Payer: 59 | Admitting: Obstetrics & Gynecology

## 2022-05-30 ENCOUNTER — Encounter: Payer: Self-pay | Admitting: Obstetrics & Gynecology

## 2022-05-30 VITALS — BP 112/63 | HR 61 | Wt 123.0 lb

## 2022-05-30 DIAGNOSIS — N814 Uterovaginal prolapse, unspecified: Secondary | ICD-10-CM

## 2022-05-30 DIAGNOSIS — Z4689 Encounter for fitting and adjustment of other specified devices: Secondary | ICD-10-CM | POA: Diagnosis not present

## 2022-05-30 DIAGNOSIS — N840 Polyp of corpus uteri: Secondary | ICD-10-CM | POA: Diagnosis not present

## 2022-05-30 NOTE — Progress Notes (Signed)
PELVIC US TA/TV: homogeneous retroverted uterus,WNL,EEC 1.4 mm,normal ovaries,ovaries appear mobile,no free fluid,no pain during ultrasound

## 2022-05-30 NOTE — Progress Notes (Signed)
     GYN VISIT Patient name: Jennifer Suarez MRN [id number]  Date of birth: August 30, 1961 Chief Complaint:   pessary  History of Present Illness:   Jennifer Suarez is a 61 y.o. G51P1011 PM female being seen today for the following concerns:  -Pessary maintenance:    She uses a Milex ring #2.  She cleans/removes the ring on her own.  She has noted some bleeding with removal, but otherwise no issues. She reports little vaginal discharge and little vaginal bleeding.  Overall happy with the device and feels it has made a difference for her.  Likert scale(1 not bothersome -5 very bothersome)  :  1  Review of Systems:   Pertinent items are noted in HPI Denies fever/chills, dizziness, headaches, visual disturbances, fatigue, shortness of breath, chest pain, abdominal pain, vomiting. Pertinent History Reviewed:  Reviewed past medical,surgical, social, obstetrical and family history.  Reviewed problem list, medications and allergies. Physical Assessment:   Vitals:   05/30/22 1454  BP: 112/63  Pulse: 61  Weight: 123 lb (55.8 kg)  Body mass index is 24.84 kg/m.       Physical Examination:   General appearance: alert, well appearing, and in no distress  Psych: mood appropriate, normal affect  Skin: warm & dry   Cardiovascular: normal heart rate noted  Respiratory: normal respiratory effort, no distress  Abdomen: soft, non-tender   GU: normal external genitalia.   The pessary is removed. Vagina: Exam reveals no undue vaginal mucosal pressure of breakdown, no discharge and no vaginal bleeding.  Vaginal Epithelial Abnormality Classification System:   0 0    No abnormalities 1    Epithelial erythema 2    Granulation tissue 3    Epithelial break or erosion, 1 cm or less 4    Epithelial break or erosion, 1 cm or greater   Irrigation completed- no vaginal bleeding or abnormalities noted.  Pessary replaced without difficulty  Extremities: no edema   Chaperone:  pt declined      Pelvic US today: PELVIC US TA/TV: homogeneous retroverted uterus,WNL,EEC 1.4 mm,normal ovaries,ovaries appear mobile,no free fluid,no pain during ultrasound   Assessment & Plan:     ICD-10-CM   1. Uterine prolapse  N81.4     2. Pessary maintenance  Z46.89     3. Endometrial polyp  N84.0       -Reviewed today's [place], prior endometrial polyp has now resolved.  Normal [place] -doing well with pessary.  Discussed that she can go several weeks if not months before cleaning/replacing. -Briefly discussed vaginal estrogen therapy- pt declined -f/u prn or 90yr annual   Return in about 1 year (around 05/31/2023) for Annual/pessary maintenance, with Dr. Charlotta Newton.   [name], DO Attending Obstetrician & Gynecologist, [facility] for [employer], [facility]

## 2022-06-05 ENCOUNTER — Encounter: Payer: Self-pay | Admitting: Allergy & Immunology

## 2022-06-05 ENCOUNTER — Ambulatory Visit: Payer: 59 | Admitting: Allergy & Immunology

## 2022-06-05 VITALS — BP 122/64 | HR 75 | Temp 98.3°F | Resp 14 | Ht 59.06 in | Wt 123.6 lb

## 2022-06-05 DIAGNOSIS — T7800XD Anaphylactic reaction due to unspecified food, subsequent encounter: Secondary | ICD-10-CM

## 2022-06-05 DIAGNOSIS — Z889 Allergy status to unspecified drugs, medicaments and biological substances status: Secondary | ICD-10-CM

## 2022-06-05 NOTE — Progress Notes (Signed)
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 Anaphylactic shock due to food - We are going to get an alpha gal panel to check on which meats you are allergic to. - EpiPen training provided.  - Anaphylaxis management plan provided. - You need to avoid gel capsules (I agree with talking to the pharmacists about this).  2. Multiple antibiotic allergies - I did encourage her to remove these allergies from her list since she passed the challenges but she prefers to leave them on just to be on the safe side. - This does limit her options, but at least we know that she has tolerated these medications without a problem. - It should be noted that she had a delayed rash to ciprofloxacin, but these reactions are not life threatening and should not necessarily limit their utilization in the future if needed.   3. Return in about 6 months (around 12/06/2022).   Subjective:   Jennifer Suarez is a 61 y.o. female presenting today for follow up of  Chief Complaint  Patient presents with   Allergy Testing    Wants to be tested for Alpha Gal as she has been told she was allergic to all red meats. Wants to know what specific meats she is allergic to.     Jennifer Suarez has a history of the following: Patient Active Problem List   Diagnosis Date Noted   Nitrofurantoin adverse reaction 09/01/2020   Family hx of colon cancer 08/28/2016   C. difficile colitis 10/01/2011   Lower abdominal pain 08/27/2011   Diarrhea 08/27/2011   Diverticulitis of colon 08/27/2011    History obtained from: chart review and patient.  Jennifer Suarez is a 61 y.o. female presenting for a follow up visit.  I last saw her in November 2021 for a sulfa  challenge.  She passed this with flying colors. This was after passing three additional challenges to antibiotics.   She has had a couple of years of terrible medical diagnoses with her husband. He is slowly getting better. She has had problems including a uterine prolapse. It has been fortunately eventful year.  However, she presents today to discuss a different problem.  She was bit by a tick in June 2023. She went to see her PCP and she woke up with trouble standing and cold sweats. She woke up at 6:30 and she was nauseated and could barely hold herself upright. She up by herself. She was diagnosed with alpha gal eventually.  She is wondering about the trajectory of this.  She also wants to know which types of red meat she needs to avoid.  She was told that we continued testing to tell her which means she might be able to tolerate.  She is eating a lot of chicken and fish and [place]. She has been tolerating dairy without a problem. She tolerates butter, but does not eat ice cream.   She has been avoiding all red meats since that time. She wants to know what she needs to stay away from. She does have an EpiPen.  She has changed a lot of her medications from gel capsules.  She is going to go talk to a pharmacist about the medication she is  on to make sure there are no mammalian meat products in them.  She is changing to [employer].   Otherwise, there have been no changes to her past medical history, surgical history, family history, or social history.    Review of Systems  Constitutional: Negative.  Negative for fever, malaise/fatigue and weight loss.  HENT: Negative.  Negative for congestion, ear discharge, ear pain and sinus pain.   Eyes:  Negative for pain, discharge and redness.  Respiratory:  Negative for cough, sputum production, shortness of breath and wheezing.   Cardiovascular: Negative.  Negative for chest pain and palpitations.  Gastrointestinal:  Negative for abdominal  pain, constipation, diarrhea, heartburn, nausea and vomiting.  Skin: Negative.  Negative for itching and rash.  Neurological:  Negative for dizziness and headaches.  Endo/Heme/Allergies:  Positive for environmental allergies. Does not bruise/bleed easily.       Objective:   Blood pressure 122/64, pulse 75, temperature 98.3 F (36.8 C), temperature source Temporal, resp. rate 14, height 4' 11.06" (1.5 m), weight 123 lb 9.6 oz (56.1 kg), SpO2 98 %. Body mass index is 24.92 kg/m.    Physical Exam Vitals reviewed.  Constitutional:      Appearance: She is well-developed.     Comments: Pleasant female. Cooperative. Talkative.  HENT:     Head: Normocephalic and atraumatic.     Right Ear: Tympanic membrane, ear canal and external ear normal.     Left Ear: Tympanic membrane, ear canal and external ear normal.     Nose: No nasal deformity, septal deviation, mucosal edema or rhinorrhea.     Right Turbinates: Not enlarged or swollen.     Left Turbinates: Not enlarged or swollen.     Right Sinus: No maxillary sinus tenderness or frontal sinus tenderness.     Left Sinus: No maxillary sinus tenderness or frontal sinus tenderness.     Mouth/Throat:     Mouth: Mucous membranes are not pale and not dry.     Pharynx: Uvula midline.  Eyes:     General: Lids are normal. No allergic shiner.       Right eye: No discharge.        Left eye: No discharge.     Conjunctiva/sclera: Conjunctivae normal.     Right eye: Right conjunctiva is not injected. No chemosis.    Left eye: Left conjunctiva is not injected. No chemosis.    Pupils: Pupils are equal, round, and reactive to light.  Cardiovascular:     Rate and Rhythm: Normal rate and regular rhythm.     Heart sounds: Normal heart sounds.  Pulmonary:     Effort: Pulmonary effort is normal. No tachypnea, accessory muscle usage or respiratory distress.     Breath sounds: Normal breath sounds. No wheezing, rhonchi or rales.  Chest:     Chest wall: No  tenderness.  Lymphadenopathy:     Cervical: No cervical adenopathy.  Skin:    Coloration: Skin is not pale.     Findings: No abrasion, erythema, petechiae or rash. Rash is not papular, urticarial or vesicular.  Neurological:     Mental Status: She is alert.  Psychiatric:        Behavior: Behavior is cooperative.      Diagnostic studies: labs sent instead        Malachi Bonds, MD  [facility]

## 2022-06-05 NOTE — Patient Instructions (Addendum)
1. Anaphylactic shock due to food - We are going to get an alpha gal panel to check on which meats you are allergic to. - EpiPen training provided.  - Anaphylaxis management plan provided. - You need to avoid gel capsules (I agree with talking to the pharmacists about this).  2. Return in about 6 months (around 12/06/2022).    Please inform us of any Emergency Department visits, hospitalizations, or changes in symptoms. Call us before going to the ED for breathing or allergy symptoms since we might be able to fit you in for a sick visit. Feel free to contact us anytime with any questions, problems, or concerns.  It was a pleasure to see you again today!  Websites that have reliable patient information: 1. American Academy of Asthma, Allergy, and Immunology: www.aaaai.org 2. Food Allergy Research and Education (FARE): foodallergy.org 3. Mothers of Asthmatics: http://www.asthmacommunitynetwork.org 4. American College of Allergy, Asthma, and Immunology: www.acaai.org   COVID-19 Vaccine Information can be found at: [email or web address] For questions related to vaccine distribution or appointments, please email [email or web address] or call [phone].   We realize that you might be concerned about having an allergic reaction to the COVID19 vaccines. To help with that concern, WE ARE OFFERING THE COVID19 VACCINES IN OUR OFFICE! Ask the front desk for dates!     "Like" [place] on Facebook and Instagram for our latest updates!      A healthy democracy works best when [employer] participate! Make sure you are registered to vote! If you have moved or changed any of your contact information, you will need to get this updated before voting!  In some cases, you MAY be able to register to vote online: [email or web address]      Alpha-gal and Red Meat Allergy   Overview An allergy to "alpha-gal" refers to  having a severe and potentially life-threatening allergy to a carbohydrate molecule called galactose-alpha-1,3-galactose that is found in most mammalian or "red meat". Unlike other food allergies which typically occur within minutes of ingestion, symptoms from eating red meat such as pork, lamb or beef may be delayed, occurring 3-8 hours after eating. Most food allergies are directed against a protein molecule, but alpha-gal is unusual because it is a carbohydrate, and a delay in its absorption may explain the delay in symptoms.  What are the symptoms of an alpha-gal allergy? As with other food allergies, signs or symptoms of an allergy to alpha-gal may include: Hives and itching  Swelling of your lips, face or eyelids  Shortness of breath, cough or wheezing  Abdominal pain, nausea, diarrhea or vomiting The most severe reaction, anaphylaxis, can present as a combination of several of these symptoms, may include low blood pressure, and is potentially fatal.  Because these symptoms are delayed, you may only wake up with them in the middle of the night after an evening meal.  How is an alpha-gal allergy diagnosed? Diagnosis of this allergy starts with your allergist taking an appropriate history and physical examination. Because the onset is usually quite delayed, it can be hard to associate the symptoms with eating red meat many hours previously. Triggers include any red meat - including beef, pork, lamb or even horse products. It may occur after eating hotdogs and hamburgers. In very rare cases the reaction may extend to milk or dairy proteins and gelatin.  Your allergist may recommend testing that includes skin tests to the relevant animal proteins and blood tests which measure the levels of a specific immunoglobulin E (IgE) antibody,  to mammalian meats. An investigational blood test, IgE against alpha-gal itself, may also aid in the diagnosis.  How is an alpha-gal allergy treated? Immediate  symptoms such as hives or shortness of breath are treated the same as any other food allergy - in an urgent care setting with anti-histamines, epinephrine and other medications. Prevention long-term involves avoidance of all red meat in sensitized individuals. You may be advised to carry an epinephrine auto-injector, to be used in case of subsequent accidental exposures and reaction. These measures do not necessarily mean switching to a full vegetarian diet, since poultry and fish can be consumed and do not cause similar reactions. As with other food allergies, there is the possibility that over time the sensitivity diminishes - although these changes may take many years to become apparent.  How do you become allergic to alpha-gal? Alpha-gal is a molecule carried in the saliva of the [place] tick and other potential arthropods typically after feeding on mammalian blood. People that are bitten by the tick, especially those that are bitten repeatedly, are at risk of becoming sensitized and producing the IgE necessary to then cause allergic reactions. Interestingly, allergic reactions may occur to red meat, to subsequent tick bites, and even to medications that contain alpha-gal. Cetuximab is a cancer medication that contains alpha-gal, and people who have had allergic reactions to this medication (these are typically immediate reactions, because it is infused intravenously) have a higher risk for red meat allergy and are likely to have been bitten by ticks in the past. As might be expected, the incidence of tick bites is much higher in the [place] the traditional habitat for the tick. However, cases are now increasingly reported in the [place] and [place] states. And it is a phenomenon that has been observed worldwide, with different ticks responsible for similar cases of red meat allergy in many other countries such as [place], [place] and [place].  The discovery of this peculiar  allergy has allowed researchers to correlate tick bites with many cases of anaphylaxis that would previously have been classified as 'idiopathic', or of unknown cause. Also, while it was originally thought that the [employer] tick had to feast on mammalian blood in order to carry the alpha-gal molecule, more recent research has shown that it may carry this molecule and be capable of sensitizing humans independently.  How do you prevent an alpha-gal allergy? Because this allergy is predominantly tick born, you are more likely at risk if you often go outdoors in wooded areas for activities such as hiking, fishing or hunting. The key strategy is to prevent tick bites. This may include wearing long sleeved shirts or pants, using appropriate insect repellants, and surveying for ticks after spending time outdoors. Any observed ticks should be removed carefully by cleaning the site with rubbing alcohol, then using tweezers to pull the tick's head up carefully from the skin using steady pressure. Clean your hands and the site one more time and make sure not to crush the tick between your fingers.

## 2022-06-07 ENCOUNTER — Encounter: Payer: Self-pay | Admitting: Allergy & Immunology

## 2022-06-08 LAB — TRYPTASE: Tryptase: 9.6 ug/L (ref 2.2–13.2)

## 2022-06-08 LAB — ALPHA-GAL PANEL
Allergen Lamb IgE: 2.56 kU/L — AB
Beef IgE: 4.5 kU/L — AB
IgE (Immunoglobulin E), Serum: 98 IU/mL (ref 6–495)
O215-IgE Alpha-Gal: 9 kU/L — AB
Pork IgE: 2.94 kU/L — AB

## 2022-06-10 ENCOUNTER — Encounter: Payer: Self-pay | Admitting: Allergy & Immunology

## 2022-07-09 ENCOUNTER — Other Ambulatory Visit (HOSPITAL_COMMUNITY): Payer: Self-pay | Admitting: Internal Medicine

## 2022-07-09 DIAGNOSIS — Z1231 Encounter for screening mammogram for malignant neoplasm of breast: Secondary | ICD-10-CM

## 2022-07-23 ENCOUNTER — Ambulatory Visit: Payer: Self-pay | Admitting: Obstetrics and Gynecology

## 2022-08-22 ENCOUNTER — Encounter: Payer: Self-pay | Admitting: Allergy & Immunology

## 2022-08-23 ENCOUNTER — Telehealth: Payer: Self-pay

## 2022-08-23 NOTE — Telephone Encounter (Signed)
Written by DD

## 2022-08-23 NOTE — Telephone Encounter (Signed)
Patient called with concerns about Alpha-Gal. She sent a mychart message on 08/22/22 to Dr. Ernst Bowler in regards to what medications she should avoid. I informed her to avoid any medications that contain Gelatin and she is ok to drink alcohol unless she has gluten/ wheat intolerance.  I am mailing out avoidance measures for the patient to read over and patient verified mailing address.

## 2022-08-25 NOTE — Telephone Encounter (Signed)
Thank you. Please have her call the clinic if she is having symptoms after any medications in particular. Thank you

## 2022-08-26 NOTE — Telephone Encounter (Signed)
Called patient and let her know that if she has any problems to call the office.

## 2022-08-26 NOTE — Telephone Encounter (Signed)
Thanks for taking care of this, Webb Silversmith!  Salvatore Marvel, MD [facility] [place] [facility]

## 2022-11-06 ENCOUNTER — Ambulatory Visit: Payer: Self-pay | Admitting: Dermatology

## 2022-12-06 ENCOUNTER — Encounter: Payer: Self-pay | Admitting: Allergy & Immunology

## 2022-12-06 ENCOUNTER — Other Ambulatory Visit: Payer: Self-pay

## 2022-12-06 ENCOUNTER — Ambulatory Visit: Payer: 59 | Admitting: Allergy & Immunology

## 2022-12-06 VITALS — BP 128/72 | HR 66 | Temp 97.9°F | Resp 20 | Ht 59.0 in | Wt 132.0 lb

## 2022-12-06 DIAGNOSIS — T7800XD Anaphylactic reaction due to unspecified food, subsequent encounter: Secondary | ICD-10-CM

## 2022-12-06 DIAGNOSIS — Z889 Allergy status to unspecified drugs, medicaments and biological substances status: Secondary | ICD-10-CM

## 2022-12-06 NOTE — Patient Instructions (Addendum)
1. Anaphylactic shock due to food - We are going to get an alpha gal panel to see where your levels are trending. - EpiPen is up to date.  - We will call you in 1-2 weeks with the results of the testing.  2. Return in about 6 months (around 06/06/2023).    Please inform us of any Emergency Department visits, hospitalizations, or changes in symptoms. Call us before going to the ED for breathing or allergy symptoms since we might be able to fit you in for a sick visit. Feel free to contact us anytime with any questions, problems, or concerns.  It was a pleasure to see you again today!  Websites that have reliable patient information: 1. American Academy of Asthma, Allergy, and Immunology: www.aaaai.org 2. Food Allergy Research and Education (FARE): foodallergy.org 3. Mothers of Asthmatics: http://www.asthmacommunitynetwork.org 4. American College of Allergy, Asthma, and Immunology: www.acaai.org   COVID-19 Vaccine Information can be found at: [email or web address].uk For questions related to vaccine distribution or appointments, please email [email or web address] or call [phone].   We realize that you might be concerned about having an allergic reaction to the COVID19 vaccines. To help with that concern, WE ARE OFFERING THE COVID19 VACCINES IN OUR OFFICE! Ask the front desk for dates!     "Like" [place] on Facebook and Instagram for our latest updates!      A healthy democracy works best when [employer] participate! Make sure you are registered to vote! If you have moved or changed any of your contact information, you will need to get this updated before voting!  In some cases, you MAY be able to register to vote online: [email or web address]

## 2022-12-06 NOTE — Progress Notes (Signed)
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 Anaphylactic shock due to food - We are going to get an alpha gal panel to see where your levels are trending. - EpiPen is up to date.  - We will call you in 1-2 weeks with the results of the testing. - I do not know of any immunological way that acupuncture would cure her allergies. - The patient does not have acupuncture had low levels that were likely to lose her sensitization anyway. - I have seen many patients going to acupuncture and were told to eat meat but then experienced anaphylaxis.   2.  Multiple drug allergies  - I am fine with keeping the allergies on your list, but if there are no alternatives to treatment I would recommend just going ahead and getting the antibiotics.    3. Return in about 6 months (around 06/06/2023).     Subjective:   Jennifer Suarez is a 62 y.o. female presenting today for follow up of  Chief Complaint  Patient presents with   Follow-up    Follow up pt states she is doing good and staying away from alpha gal.    Jennifer Suarez has a history of the following: Patient Active Problem List   Diagnosis Date Noted   Nitrofurantoin adverse reaction 09/01/2020   Family hx of colon cancer 08/28/2016   C. difficile colitis 10/01/2011   Lower abdominal pain 08/27/2011   Diarrhea 08/27/2011   Diverticulitis of colon 08/27/2011    History obtained from: chart review and patient.  Jennifer Suarez is a 62 y.o. female presenting for a follow up visit.  She was last seen in August 2023.  At that time, we obtained an alpha gal panel to check on the her levels.  We reviewed EpiPen training.  She has a history of multiple  antibiotic allergies, but she has passed a Flagyl, Bactrim, nitrofurantoin, and ciprofloxacin challenge.  Since the last visit, she has done well. She accidentally ate a bowl of beef soup (broth only at a [employer]). She did fine with this. She even told the waitress about her and she still gave her the beef broth. She did not have any GI symptoms or rash or anything, thankful. They were celebrating her son's birthday. EpiPen expires in July 2024.   She does see a chiropractor who does acupuncture. She was told that acupuncture could cure her alpha gal. She is wondering what my thoughts were on that.  They are going through a lot with her husband. He has Parkinson's, lung cancer, and lupus.  He was a smoker, but stopped years ago.  Otherwise, there have been no changes to her past medical history, surgical history, family history, or social history.    Review of Systems  Constitutional: Negative.  Negative for fever, malaise/fatigue and weight loss.  HENT: Negative.  Negative for congestion, ear discharge, ear pain and sinus pain.   Eyes:  Negative for pain, discharge and redness.  Respiratory:  Negative for cough, sputum production, shortness of breath and wheezing.   Cardiovascular: Negative.  Negative for chest pain and palpitations.  Gastrointestinal:  Negative for abdominal pain, constipation, diarrhea, heartburn, nausea and vomiting.  Skin: Negative.  Negative for itching and rash.  Neurological:  Negative for dizziness and headaches.  Endo/Heme/Allergies:  Positive for environmental allergies. Does not bruise/bleed easily.       Objective:   Blood pressure 128/72, pulse 66, temperature 97.9 F (36.6 C), resp. rate 20, height 4\' 11"  (1.499 m), weight 132 lb (59.9 kg), SpO2 97 %. Body mass index is 26.66 kg/m.    Physical Exam Vitals reviewed.  Constitutional:      Appearance: She is well-developed.     Comments: Pleasant female. Cooperative. Talkative.  HENT:      Head: Normocephalic and atraumatic.     Right Ear: Tympanic membrane, ear canal and external ear normal.     Left Ear: Tympanic membrane, ear canal and external ear normal.     Nose: No nasal deformity, septal deviation, mucosal edema or rhinorrhea.     Right Turbinates: Enlarged, swollen and pale.     Left Turbinates: Enlarged, swollen and pale.     Right Sinus: No maxillary sinus tenderness or frontal sinus tenderness.     Left Sinus: No maxillary sinus tenderness or frontal sinus tenderness.     Mouth/Throat:     Mouth: Mucous membranes are not pale and not dry.     Pharynx: Uvula midline.  Eyes:     General: Lids are normal. No allergic shiner.       Right eye: No discharge.        Left eye: No discharge.     Conjunctiva/sclera: Conjunctivae normal.     Right eye: Right conjunctiva is not injected. No chemosis.    Left eye: Left conjunctiva is not injected. No chemosis.    Pupils: Pupils are equal, round, and reactive to light.  Cardiovascular:     Rate and Rhythm: Normal rate and regular rhythm.     Heart sounds: Normal heart sounds.  Pulmonary:     Effort: Pulmonary effort is normal. No tachypnea, accessory muscle usage or respiratory distress.     Breath sounds: Normal breath sounds. No wheezing, rhonchi or rales.  Chest:     Chest wall: No tenderness.  Lymphadenopathy:     Cervical: No cervical adenopathy.  Skin:    Coloration: Skin is not pale.     Findings: No abrasion, erythema, petechiae or rash. Rash is not papular, urticarial or vesicular.  Neurological:     Mental Status: She is alert.  Psychiatric:        Behavior: Behavior is cooperative.      Diagnostic studies: labs sent instead      Salvatore Marvel, MD  [facility]

## 2022-12-08 LAB — ALPHA-GAL PANEL
Allergen Lamb IgE: 1.49 kU/L — AB
Beef IgE: 1.91 kU/L — AB
IgE (Immunoglobulin E), Serum: 57 IU/mL (ref 6–495)
O215-IgE Alpha-Gal: 3.65 kU/L — AB
Pork IgE: 1.59 kU/L — AB

## 2022-12-13 ENCOUNTER — Encounter: Payer: Self-pay | Admitting: Allergy & Immunology

## 2023-04-30 IMAGING — MG MM DIGITAL SCREENING BILAT W/ TOMO AND CAD
8 series · 9 of 24 positions shown · non-contrast
Comparison: Previous exam(s).

CLINICAL DATA: Screening.

EXAM:
DIGITAL SCREENING BILATERAL MAMMOGRAM WITH TOMOSYNTHESIS AND CAD
TECHNIQUE: Bilateral screening digital craniocaudal and mediolateral oblique
mammograms were obtained. Bilateral screening digital breast
tomosynthesis was performed. The images were evaluated with
computer-aided detection.

[R MLO synth-2D]
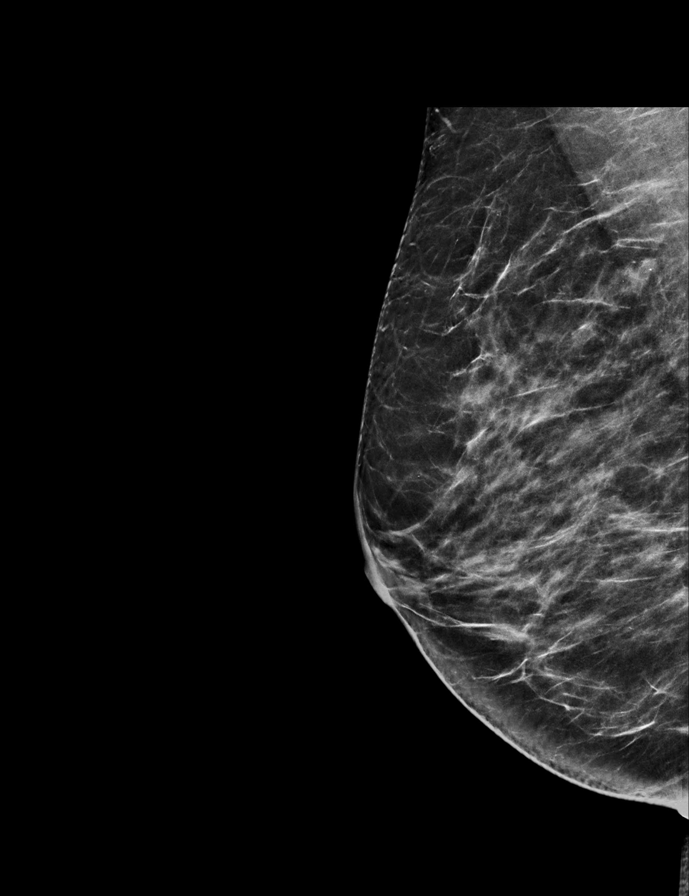

[R CC synth-2D]
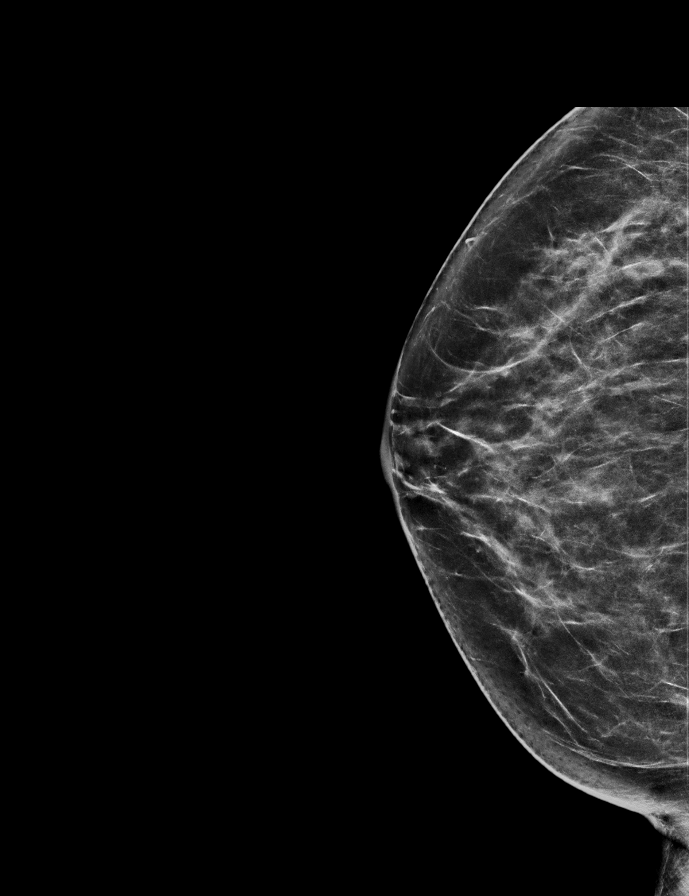

[L CC synth-2D]
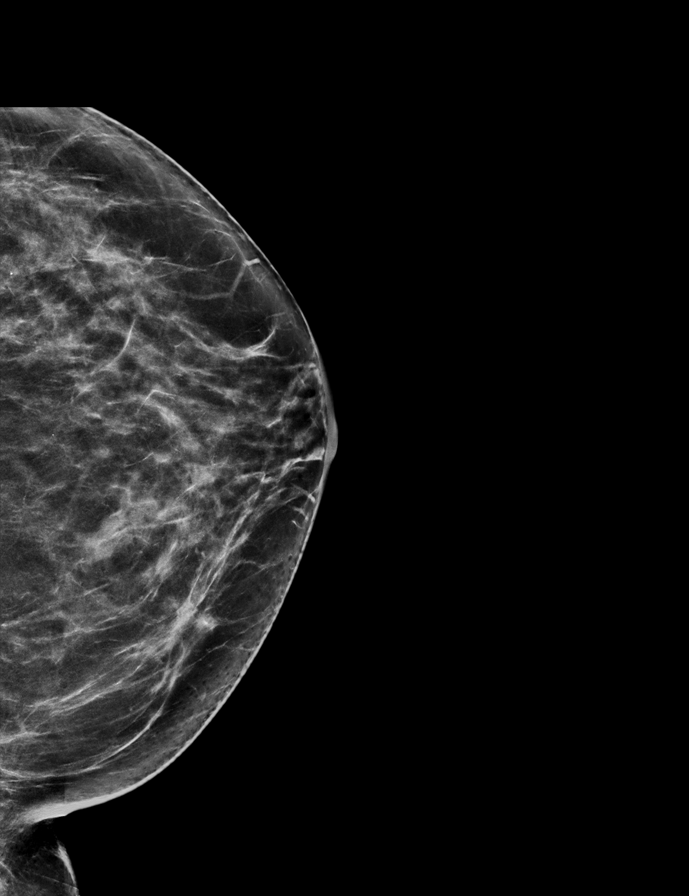

[L MLO synth-2D]
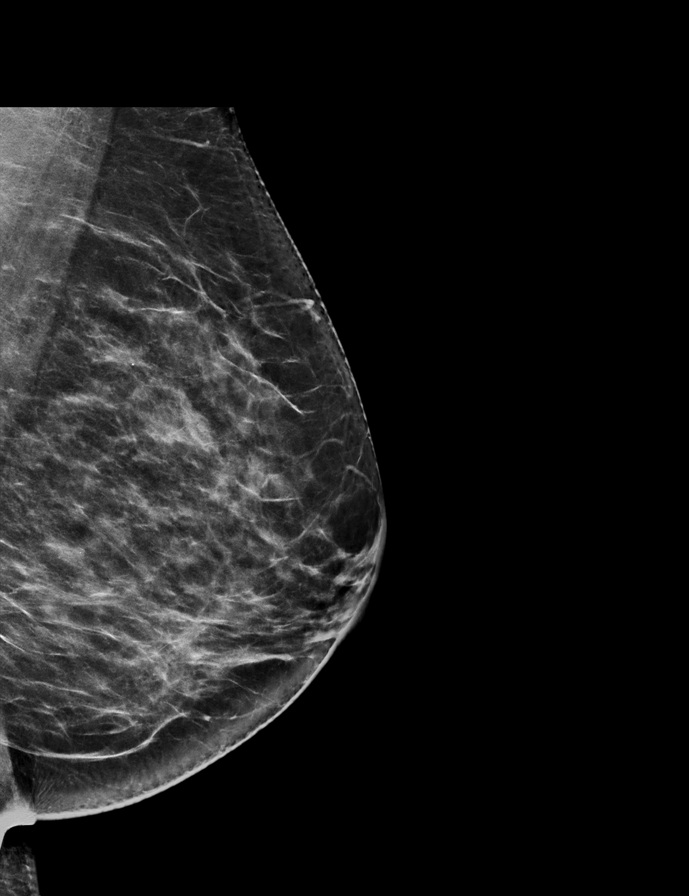

[L CC tomo · 2 of 70 frames shown]
[frame 23/70]
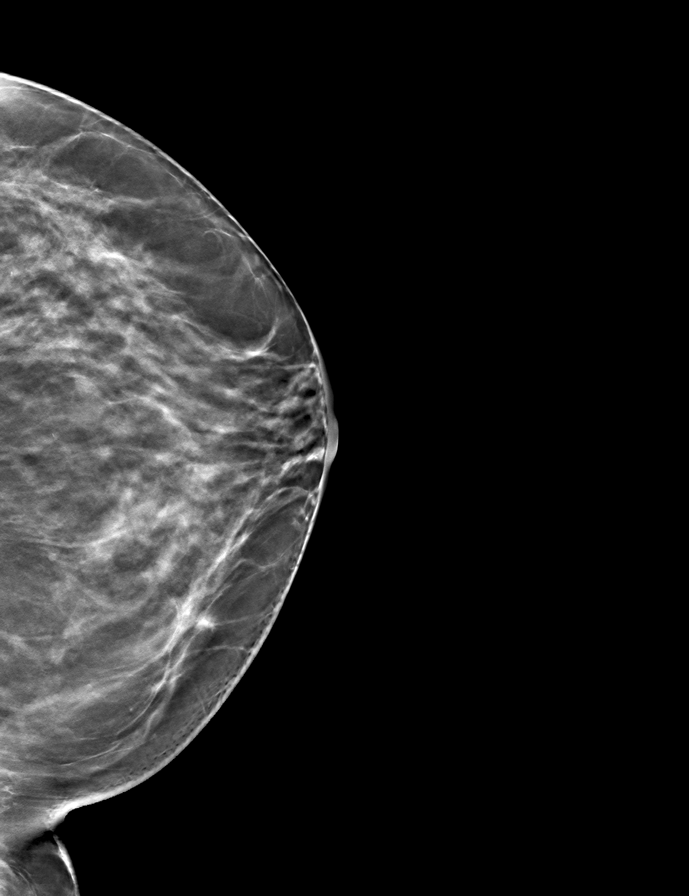
[frame 35/70]
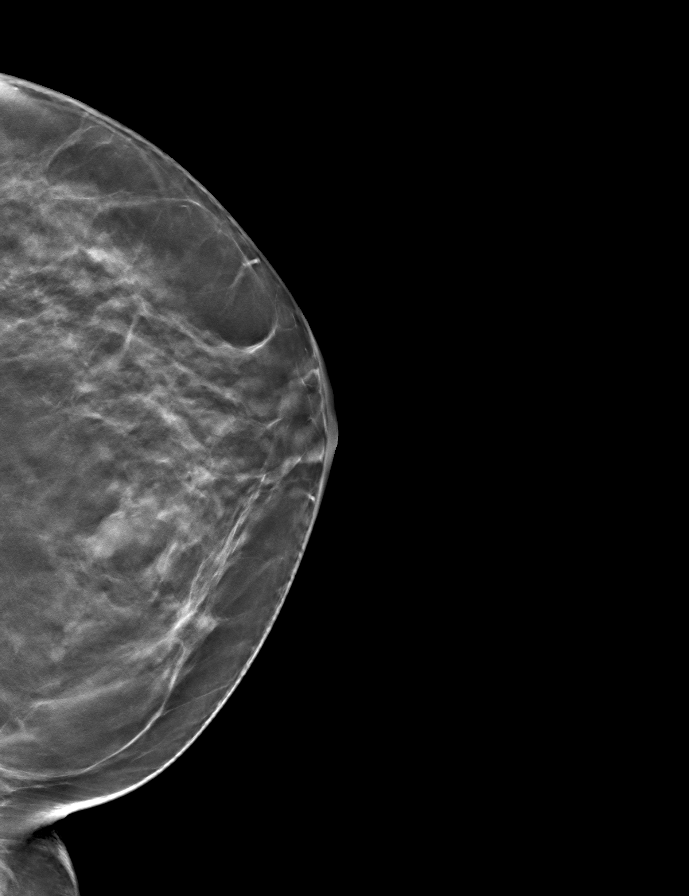

[L MLO tomo · tomo slice 37/72.0]
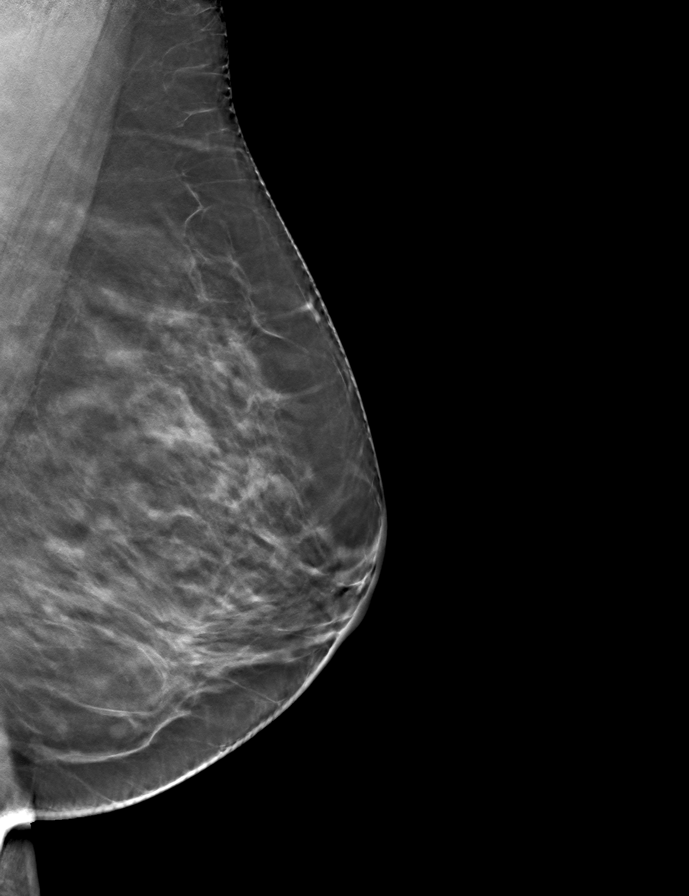

[R CC tomo · tomo slice 34/67.0]
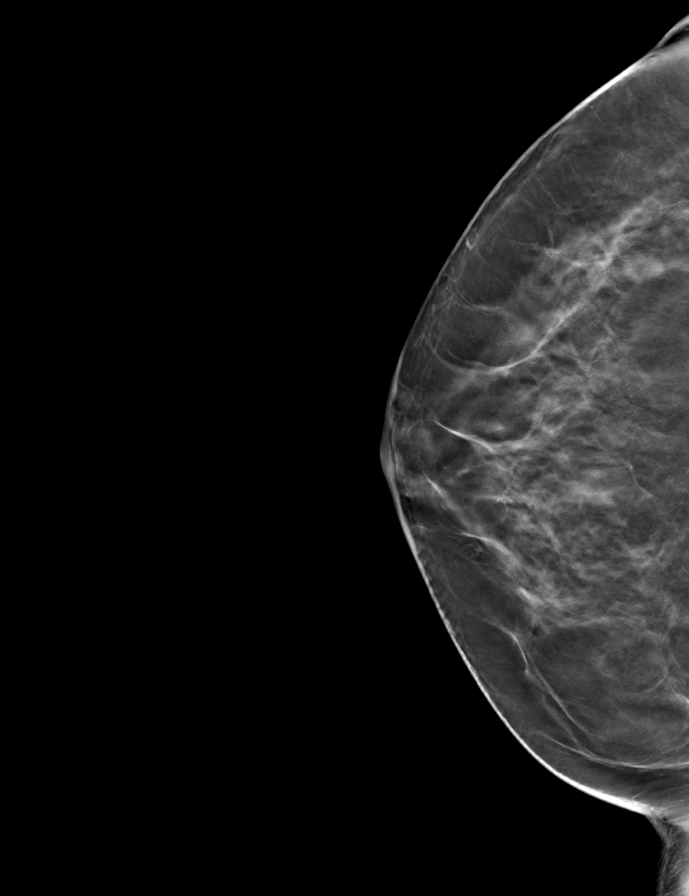

[R MLO tomo · tomo slice 35/68.0]
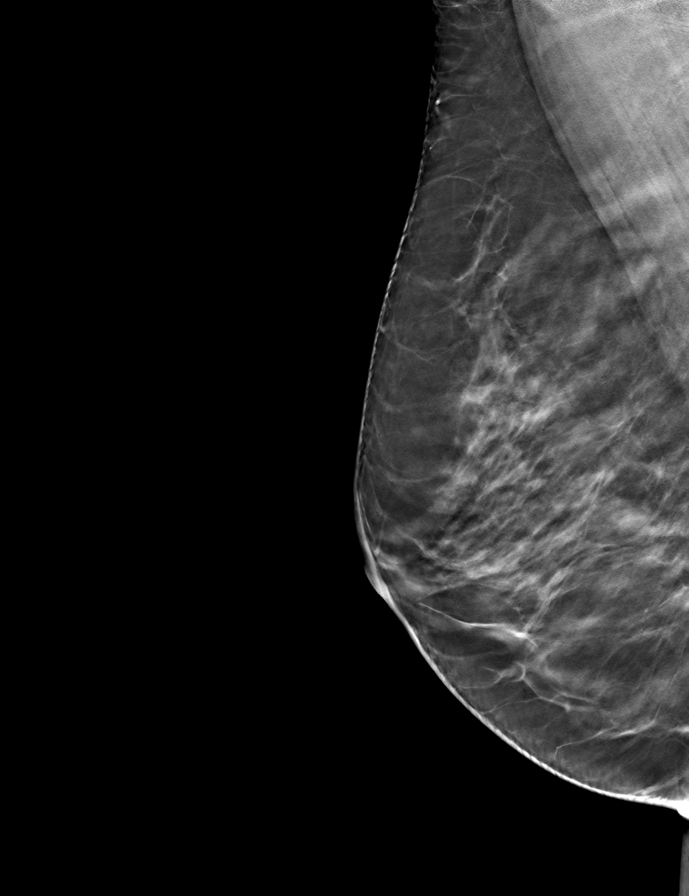

[9 of 24 positions shown; findings below may reference images not displayed]

ACR Breast Density Category c: The breast tissue is heterogeneously
dense, which may obscure small masses.
FINDINGS: There are no findings suspicious for malignancy.
IMPRESSION: No mammographic evidence of malignancy. A result letter of this
screening mammogram will be mailed directly to the patient.

RECOMMENDATION:
Screening mammogram in one year. (Code:Q3-W-BC3)

BI-RADS CATEGORY  1: Negative.

## 2023-06-13 ENCOUNTER — Ambulatory Visit: Payer: 59 | Admitting: Allergy & Immunology

## 2023-07-18 ENCOUNTER — Encounter: Payer: Self-pay | Admitting: Obstetrics & Gynecology

## 2023-07-18 ENCOUNTER — Ambulatory Visit (INDEPENDENT_AMBULATORY_CARE_PROVIDER_SITE_OTHER): Payer: 59 | Admitting: Obstetrics & Gynecology

## 2023-07-18 VITALS — BP 137/76 | HR 71 | Ht 59.0 in | Wt 129.8 lb

## 2023-07-18 DIAGNOSIS — Z4689 Encounter for fitting and adjustment of other specified devices: Secondary | ICD-10-CM | POA: Diagnosis not present

## 2023-07-18 DIAGNOSIS — Z01419 Encounter for gynecological examination (general) (routine) without abnormal findings: Secondary | ICD-10-CM

## 2023-07-18 DIAGNOSIS — N814 Uterovaginal prolapse, unspecified: Secondary | ICD-10-CM

## 2023-07-18 DIAGNOSIS — Z1231 Encounter for screening mammogram for malignant neoplasm of breast: Secondary | ICD-10-CM | POA: Diagnosis not present

## 2023-07-18 DIAGNOSIS — N952 Postmenopausal atrophic vaginitis: Secondary | ICD-10-CM

## 2023-07-18 MED ORDER — PREMARIN 0.625 MG/GM VA CREA: TOPICAL_CREAM | VAGINAL | 3 refills | Status: DC

## 2023-07-18 NOTE — Progress Notes (Signed)
WELL-WOMAN EXAMINATION Patient name: Jennifer Suarez MRN [id number]  Date of birth: 03/05/1961 Chief Complaint:   Annual Exam (Has pessary with her. States it is working out well.)  History of Present Illness:   Jennifer Suarez is a 62 y.o. G53P1011 PM female being seen today for a routine well-woman exam and follow-up regarding the following  Pessary maintenance: Patient uses Milex ring #2 and self removes/cleans.  She notes some light spotting when she takes out the device due to discomfort but otherwise denies vaginal bleeding, discharge, itching or irritation.  She denies pelvic or abdominal pain.  She overall is happy with the device and feels like it is working well.  She remove this device this morning and left it at home  No LMP recorded. Patient is postmenopausal.  The current method of family planning is post menopausal status.    Last pap 2023.  Last mammogram: 2022, ordered. Last colonoscopy: per pt uptodate (followed by PCP)     07/18/2023    9:33 AM 03/25/2022    9:06 AM  Depression screen PHQ 2/9  Decreased Interest 0 0  Down, Depressed, Hopeless 0 0  PHQ - 2 Score 0 0  Altered sleeping 0 0  Tired, decreased energy 0 0  Change in appetite 0 0  Feeling bad or failure about yourself  0 0  Trouble concentrating 0 0  Moving slowly or fidgety/restless 0 0  Suicidal thoughts 0 0  PHQ-9 Score 0 0      Review of Systems:   Pertinent items are noted in HPI Denies any headaches, blurred vision, fatigue, shortness of breath, chest pain, abdominal pain, bowel movements, urination, or intercourse unless otherwise stated above.  Pertinent History Reviewed:  Reviewed past medical,surgical, social and family history.  Reviewed problem list, medications and allergies. Physical Assessment:   Vitals:   07/18/23 0929  BP: 137/76  Pulse: 71  Weight: 129 lb 12.8 oz (58.9 kg)  Height: 4\' 11"  (1.499 m)  Body mass index is 26.22 kg/m.        Physical  Examination:   General appearance - well appearing, and in no distress  Mental status - alert, oriented to person, place, and time  Psych:  She has a normal mood and affect  Skin - warm and dry, normal color, no suspicious lesions noted  Chest - effort normal, all lung fields clear to auscultation bilaterally  Heart - normal rate and regular rhythm  Neck:  midline trachea, no thyromegaly or nodules  Breasts - breasts appear normal, no suspicious masses, no skin or nipple changes or  axillary nodes  Abdomen - soft, nontender, nondistended, no masses or organomegaly  Pelvic - VULVA: normal appearing vulva with no masses, tenderness or lesions  VAGINA: normal appearing vagina with normal colo, atrophic changes noted with thin friable mucosa noted CERVIX: normal appearing cervix without discharge or lesions, no CMT.  Atrophic appearance of cervix noted.  UTERUS: uterus is felt to be normal size, shape, consistency and nontender   ADNEXA: No adnexal masses or tenderness noted.  Extremities:  No swelling or varicosities noted  Chaperone:  pt declined      Assessment & Plan:  1) Well-Woman Exam -pap up to date, reviewed ASCCP guidelines -Mammogram ordered, colonoscopy up-to-date  2) Pessary maintenance -Doing well with pessary -Encouraged use of lubricant with removal  3) Vaginal atrophy -While patient is not sexually active, discussed vaginal estrogen to help alleviate some discomfort with pessary maintenance -Discussed W HI  study, patient does not have any contraindications to estrogen use -Encouraged if not twice weekly use, usage prior to pessary removal  Orders Placed This Encounter  Procedures   MM 3D SCREENING MAMMOGRAM BILATERAL BREAST    Meds:  Meds ordered this encounter  Medications   conjugated estrogens (PREMARIN) vaginal cream    Sig: Pea sized amount (0.5g) applied vaginally twice per week    Dispense:  30 g    Refill:  3    Follow-up: Return in about 1 year  (around 07/17/2024) for Annual, please print .   [name], DO Attending Obstetrician & Gynecologist, [facility] for [employer], [facility]

## 2023-07-18 NOTE — Patient Instructions (Signed)
Please schedule a mammogram at one of the following locations:  [facility] [place]: [phone]  [facility] [place]:[phone] [address] UN[address] [phone]

## 2023-07-22 ENCOUNTER — Other Ambulatory Visit: Payer: Self-pay | Admitting: Obstetrics & Gynecology

## 2023-07-22 DIAGNOSIS — N814 Uterovaginal prolapse, unspecified: Secondary | ICD-10-CM

## 2023-07-22 DIAGNOSIS — N952 Postmenopausal atrophic vaginitis: Secondary | ICD-10-CM

## 2023-07-22 MED ORDER — ESTRADIOL 0.1 MG/GM VA CREA: TOPICAL_CREAM | VAGINAL | 6 refills | Status: DC

## 2023-07-22 NOTE — Progress Notes (Signed)
Order changed from Premarin to Estrace (generic) and sent to [facility] pharmacy for possible better coverage

## 2023-08-01 ENCOUNTER — Ambulatory Visit (HOSPITAL_COMMUNITY): Payer: 59

## 2023-08-08 ENCOUNTER — Ambulatory Visit (HOSPITAL_COMMUNITY)
Admission: RE | Admit: 2023-08-08 | Discharge: 2023-08-08 | Disposition: A | Discharge status: Home / Self Care | Payer: 59 | Source: Ambulatory Visit | Attending: Obstetrics & Gynecology | Admitting: Obstetrics & Gynecology

## 2023-08-08 DIAGNOSIS — Z1231 Encounter for screening mammogram for malignant neoplasm of breast: Secondary | ICD-10-CM | POA: Insufficient documentation

## 2024-07-01 DIAGNOSIS — E1165 Type 2 diabetes mellitus with hyperglycemia: Secondary | ICD-10-CM | POA: Diagnosis not present

## 2024-11-09 ENCOUNTER — Other Ambulatory Visit (HOSPITAL_COMMUNITY): Payer: Self-pay | Admitting: Obstetrics & Gynecology

## 2024-11-09 DIAGNOSIS — Z1231 Encounter for screening mammogram for malignant neoplasm of breast: Secondary | ICD-10-CM

## 2024-11-19 ENCOUNTER — Ambulatory Visit (HOSPITAL_COMMUNITY)
Admission: RE | Admit: 2024-11-19 | Discharge: 2024-11-19 | Disposition: A | Discharge status: Home / Self Care | Source: Ambulatory Visit | Attending: Obstetrics & Gynecology | Admitting: Obstetrics & Gynecology

## 2024-11-19 DIAGNOSIS — Z1231 Encounter for screening mammogram for malignant neoplasm of breast: Secondary | ICD-10-CM | POA: Insufficient documentation

## 2024-11-24 ENCOUNTER — Ambulatory Visit: Payer: Self-pay | Admitting: Obstetrics & Gynecology

## 2024-12-02 ENCOUNTER — Encounter: Payer: Self-pay | Admitting: Obstetrics & Gynecology

## 2024-12-02 ENCOUNTER — Ambulatory Visit

## 2024-12-02 ENCOUNTER — Ambulatory Visit: Admitting: Obstetrics & Gynecology

## 2024-12-02 VITALS — BP 133/74 | HR 76 | Ht 59.0 in | Wt 128.0 lb

## 2024-12-02 DIAGNOSIS — Z4689 Encounter for fitting and adjustment of other specified devices: Secondary | ICD-10-CM

## 2024-12-02 DIAGNOSIS — N950 Postmenopausal bleeding: Secondary | ICD-10-CM

## 2024-12-02 DIAGNOSIS — N952 Postmenopausal atrophic vaginitis: Secondary | ICD-10-CM

## 2024-12-02 DIAGNOSIS — N390 Urinary tract infection, site not specified: Secondary | ICD-10-CM

## 2024-12-02 DIAGNOSIS — N814 Uterovaginal prolapse, unspecified: Secondary | ICD-10-CM

## 2024-12-02 MED ORDER — ESTRADIOL 0.01 % VA CREA: TOPICAL_CREAM | VAGINAL | 6 refills | Status: AC

## 2024-12-02 NOTE — Addendum Note (Signed)
 Addended by: Coady Train I on: 12/02/2024 03:41 PM   Modules accepted: Orders

## 2024-12-02 NOTE — Progress Notes (Signed)
 "  GYN VISIT Patient name: Jennifer Suarez MRN [id number]  Date of birth: Dec 22, 1960 Chief Complaint:   Vaginal Bleeding  History of Present Illness:   Jennifer Suarez is a 64 y.o. G59P1011 PM female being seen today for the following concerns:  Noted vaginal spotting about 2 weeks- very light pink.  Noted some pink spotting when wiped and saw some on the pad- removed the pessary- no blood noted.  Pessary out since that time. About a week ago, again saw light pink on toilet paper.    Denies urinary frequency or urgency.  Denies hematuria that she can tell.  Denies fever or chills.  Denies pelvic or abdominal pain.  In review patient performs self maintenance of pessary due to pelvic organ prolapse.     No LMP recorded. Patient is postmenopausal.    Review of Systems:   Pertinent items are noted in HPI Denies fever/chills, dizziness, headaches, visual disturbances, fatigue, shortness of breath, chest pain, abdominal pain, vomiting Pertinent History Reviewed:   Past Surgical History:  Procedure Laterality Date   COLONOSCOPY  11/04/2005   Dr. Rozelle Broody to moderate sigmioid diverticulosis with no evidence for diverticulitis   COLONOSCOPY  10/08/2011   MFM:ozqu sided colonic diverticulosis   DILATION AND CURETTAGE OF UTERUS N/A 10/10/2017   exploratory laparoscopy     age 80   HYSTEROSCOPY N/A 10/10/2017   Laparascopic assisted sigmoid colectomy N/A 10/10/2017   NASAL SEPTUM SURGERY      Past Medical History:  Diagnosis Date   Angio-edema    Clostridium difficile colitis 08/2011   Diabetes (HCC) 2015   Diverticulitis    recurrent, treated with antibiotics 9 times since 2006. CT positive 04/2005, 07/2011, 08/2011   Hyperlipidemia    Hypertension    Irritable bowel syndrome    Nerve root inflammation 02/2022   Urticaria    Reviewed problem list, medications and allergies. Physical Assessment:   Vitals:   12/02/24 1121  BP: 133/74  Pulse: 76  Weight: 128  lb (58.1 kg)  Height: 4' 11 (1.499 m)  Body mass index is 25.85 kg/m.       Physical Examination:   General appearance: alert, well appearing, and in no distress  Psych: mood appropriate, normal affect  Skin: warm & dry   Cardiovascular: normal heart rate noted  Respiratory: normal respiratory effort, no distress  Abdomen: soft, non-tender   Pelvic: VULVA: normal appearing vulva with no masses, tenderness or lesions, VAGINA: normal appearing vagina with normal color and discharge, no lesions, CERVIX: normal appearing cervix without discharge or lesions.  No evidence of bleeding noted within vaginal vault or noted on cervix.  Uterine prolapse noted and unchanged from prior visit.  Milex ring replaced without difficulty.  Extremities: no edema   Chaperone: Aleck Blase    Assessment & Plan:  1) Postmenopausal bleeding, atrophy, pessary maintenance, pelvic organ prolapse -Suspect bleeding is due to atrophy and pessary maintenance however will plan for pelvic ultrasound to rule out underlying etiology - Discussed with patient that should endometrial lining be thickened, next up would be for endometrial biopsy - Patient was unable to obtain vaginal estrogen due to cost of medication -Patient to contact insurance regarding coverage, currently vaginal estradiol  has been sent in and patient given samples of [place]  []  will plan to leave urine sample at next visit- already voided  Meds ordered this encounter  Medications   estradiol  (ESTRACE ) 0.01 % CREA vaginal cream    Sig: Pea-sized amount twice  per week    Dispense:  43 g    Refill:  6      Orders Placed This Encounter  Procedures   US  PELVIC COMPLETE WITH TRANSVAGINAL    Return for pelvic US  next available.   Shaquoia Miers, DO Attending Obstetrician & Gynecologist, [facility] for [facility], [facility]    "

## 2024-12-02 NOTE — Progress Notes (Signed)
 PELVIC US  TA/TV: heterogeneous retroverted uterus,EEC 2.8 mm,normal ovaries,ovaries appear mobile,no free fluid,no pain during ultrasound

## 2024-12-03 ENCOUNTER — Ambulatory Visit: Payer: Self-pay | Admitting: Obstetrics & Gynecology

## 2024-12-03 LAB — URINALYSIS, ROUTINE W REFLEX MICROSCOPIC
Bilirubin, UA: NEGATIVE
Glucose, UA: NEGATIVE
Ketones, UA: NEGATIVE
Nitrite, UA: NEGATIVE
Protein,UA: NEGATIVE
RBC, UA: NEGATIVE
Specific Gravity, UA: 1.01 (ref 1.005–1.030)
Urobilinogen, Ur: 0.2 mg/dL (ref 0.2–1.0)
pH, UA: 7 (ref 5.0–7.5)

## 2024-12-03 LAB — MICROSCOPIC EXAMINATION
Bacteria, UA: NONE SEEN
Casts: NONE SEEN /LPF
Epithelial Cells (non renal): NONE SEEN /HPF (ref 0–10)
WBC, UA: NONE SEEN /HPF (ref 0–5)

## 2024-12-04 LAB — URINE CULTURE
